# Patient Record
Sex: Male | Born: 1995 | Race: White | Hispanic: No | Marital: Single | Smoking: Never smoker
Health system: Southern US, Community
[De-identification: ages and names within clinical notes are randomized; demographics above are authoritative.]

## PROBLEM LIST (undated history)

## (undated) DIAGNOSIS — B2790 Infectious mononucleosis, unspecified without complication: Secondary | ICD-10-CM

## (undated) DIAGNOSIS — F419 Anxiety disorder, unspecified: Secondary | ICD-10-CM

## (undated) DIAGNOSIS — G43909 Migraine, unspecified, not intractable, without status migrainosus: Secondary | ICD-10-CM

## (undated) DIAGNOSIS — J020 Streptococcal pharyngitis: Secondary | ICD-10-CM

## (undated) DIAGNOSIS — M869 Osteomyelitis, unspecified: Secondary | ICD-10-CM

## (undated) DIAGNOSIS — F909 Attention-deficit hyperactivity disorder, unspecified type: Secondary | ICD-10-CM

## (undated) HISTORY — PX: KNEE SURGERY: SHX244

---
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
## 2013-11-05 IMAGING — CR DG KNEE COMPLETE 4+V*L*
4 series · 4 of 4 positions shown · non-contrast
Comparison: MRI 07/31/2010.

CLINICAL DATA: Left knee pain.

LEFT KNEE - COMPLETE 4+ VIEW

[t knee ap left]
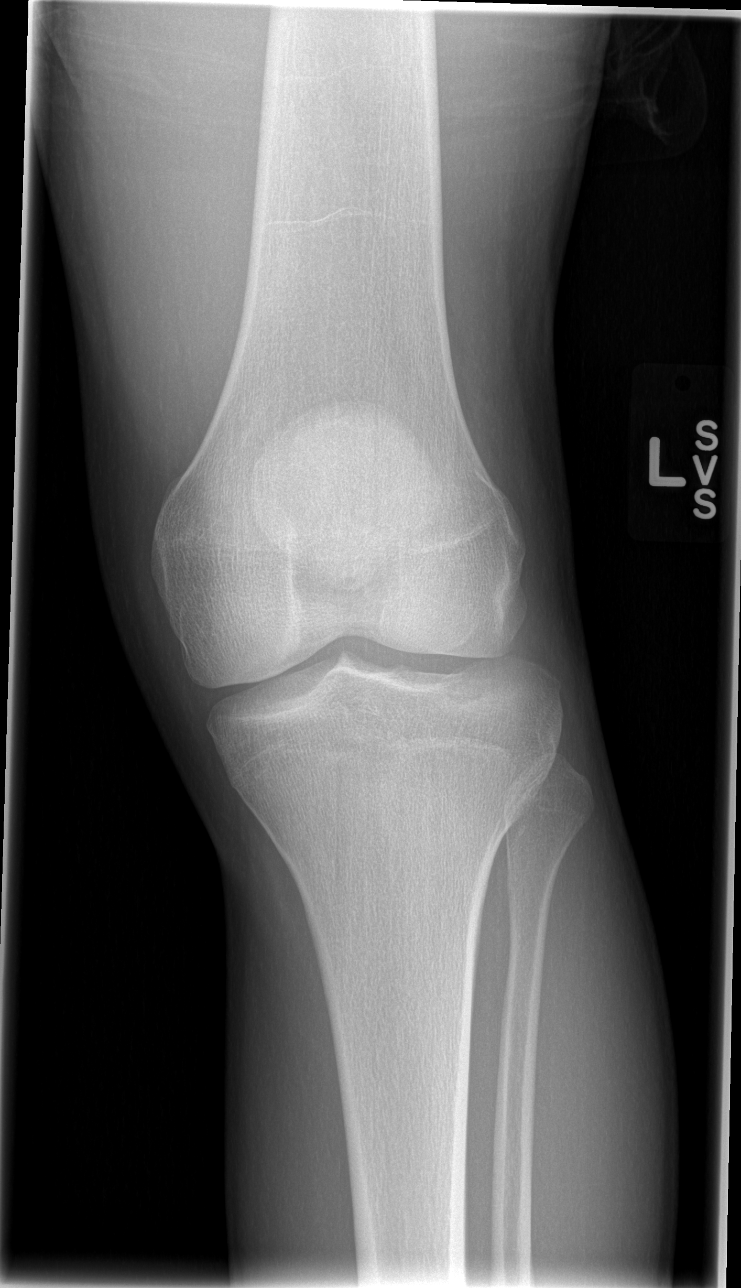

[t knee oblique left (1 of 2)]
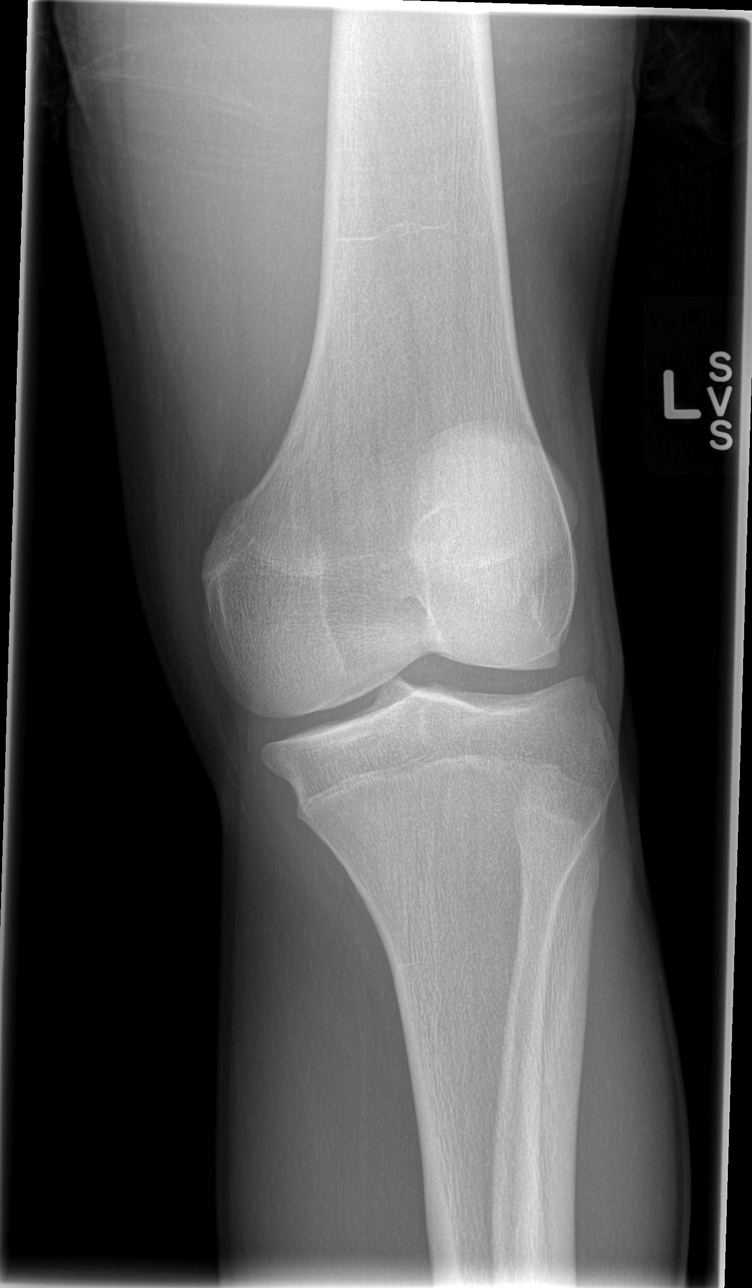

[t knee oblique left (2 of 2)]
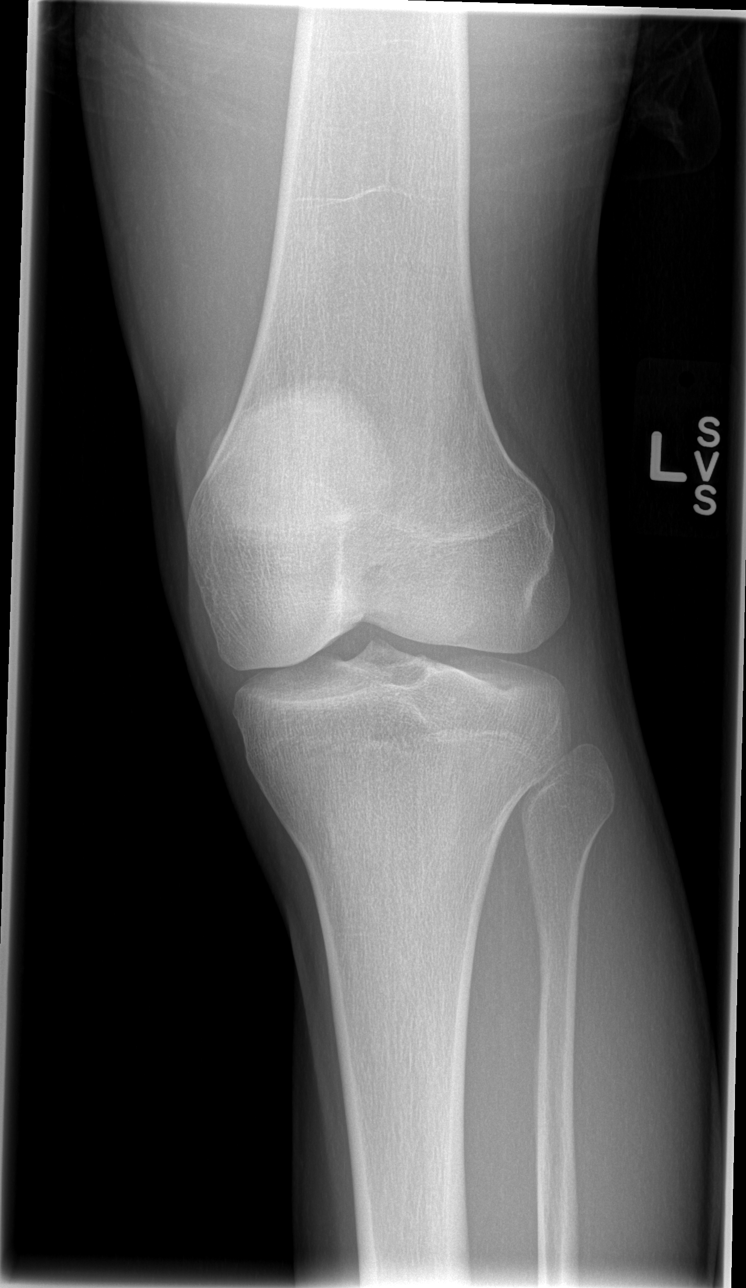

[t knee lat left]
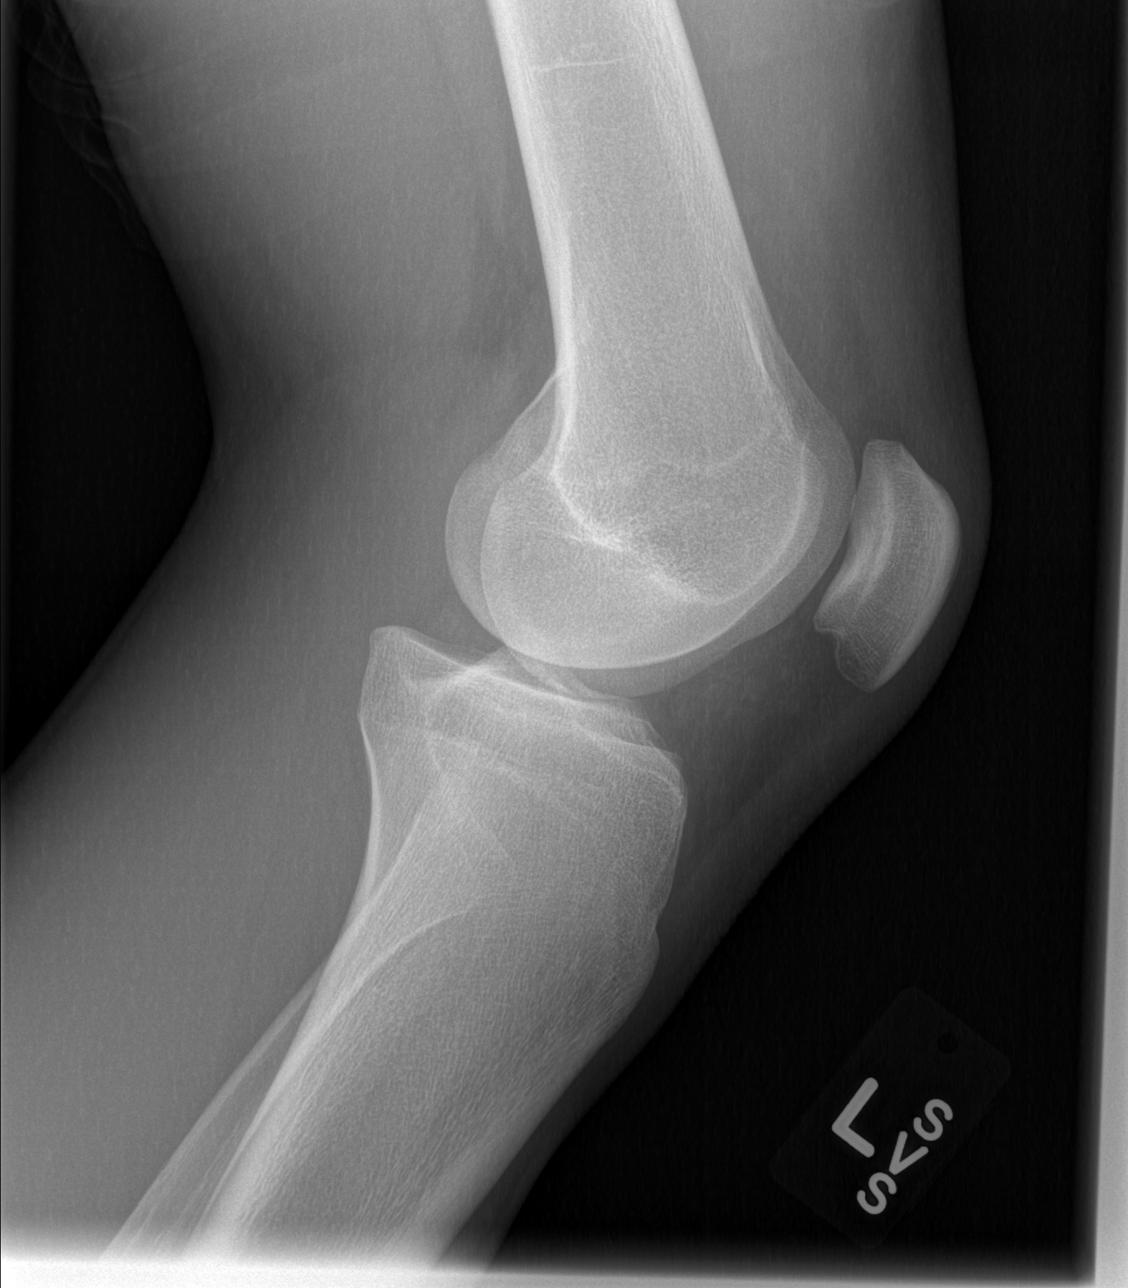

[4 of 4 positions shown; findings below may reference images not displayed]

FINDINGS: The joint spaces are maintained.  No acute bony findings.
No osteochondral lesion.  No joint effusion.
IMPRESSION: No acute bony findings or joint effusion.

## 2013-11-29 ENCOUNTER — Emergency Department: Payer: Self-pay | Admitting: Emergency Medicine

## 2013-11-29 LAB — URINALYSIS, COMPLETE
BACTERIA: NONE SEEN
Bilirubin,UR: NEGATIVE
Blood: NEGATIVE
Glucose,UR: NEGATIVE mg/dL (ref 0–75)
Ketone: NEGATIVE
LEUKOCYTE ESTERASE: NEGATIVE
Nitrite: NEGATIVE
PROTEIN: NEGATIVE
Ph: 7 (ref 4.5–8.0)
RBC,UR: NONE SEEN /HPF (ref 0–5)
SPECIFIC GRAVITY: 1.002 (ref 1.003–1.030)
SQUAMOUS EPITHELIAL: NONE SEEN
WBC UR: <1 /HPF (ref 0–5)

## 2013-11-29 LAB — CBC
HCT: 43.4 % (ref 40.0–52.0)
HGB: 14.8 g/dL (ref 13.0–18.0)
MCH: 30.2 pg (ref 26.0–34.0)
MCHC: 34 g/dL (ref 32.0–36.0)
MCV: 89 fL (ref 80–100)
PLATELETS: 188 10*3/uL (ref 150–440)
RBC: 4.9 10*6/uL (ref 4.40–5.90)
RDW: 12.4 % (ref 11.5–14.5)
WBC: 8.8 10*3/uL (ref 3.8–10.6)

## 2013-11-30 LAB — MONONUCLEOSIS SCREEN: Mono Test: NEGATIVE

## 2013-12-02 LAB — BETA STREP CULTURE(ARMC)

## 2014-01-08 ENCOUNTER — Emergency Department: Payer: Self-pay | Admitting: Emergency Medicine

## 2014-01-08 LAB — COMPREHENSIVE METABOLIC PANEL
ALBUMIN: 4.8 g/dL (ref 3.8–5.6)
ALK PHOS: 83 U/L
AST: 27 U/L (ref 10–41)
Anion Gap: 5 — ABNORMAL LOW (ref 7–16)
BILIRUBIN TOTAL: 2.3 mg/dL — AB (ref 0.2–1.0)
BUN: 12 mg/dL (ref 7–18)
CHLORIDE: 102 mmol/L (ref 98–107)
CO2: 30 mmol/L (ref 21–32)
Calcium, Total: 9.9 mg/dL (ref 9.0–10.7)
Creatinine: 0.81 mg/dL (ref 0.60–1.30)
EGFR (African American): >60
EGFR (Non-African Amer.): >60
Glucose: 93 mg/dL (ref 65–99)
Osmolality: 273 (ref 275–301)
Potassium: 3.6 mmol/L (ref 3.5–5.1)
SGPT (ALT): 27 U/L (ref 12–78)
SODIUM: 137 mmol/L (ref 136–145)
Total Protein: 8.5 g/dL (ref 6.4–8.6)

## 2014-01-08 LAB — CBC WITH DIFFERENTIAL/PLATELET
Basophil #: 0.1 10*3/uL (ref 0.0–0.1)
Basophil %: 0.8 %
Eosinophil #: 0.3 10*3/uL (ref 0.0–0.7)
Eosinophil %: 2.3 %
HCT: 44.2 % (ref 40.0–52.0)
HGB: 14.9 g/dL (ref 13.0–18.0)
LYMPHS PCT: 21.6 %
Lymphocyte #: 2.4 10*3/uL (ref 1.0–3.6)
MCH: 30.2 pg (ref 26.0–34.0)
MCHC: 33.7 g/dL (ref 32.0–36.0)
MCV: 90 fL (ref 80–100)
MONO ABS: 0.6 x10 3/mm (ref 0.2–1.0)
Monocyte %: 5.3 %
NEUTROS ABS: 7.9 10*3/uL — AB (ref 1.4–6.5)
NEUTROS PCT: 70 %
Platelet: 220 10*3/uL (ref 150–440)
RBC: 4.93 10*6/uL (ref 4.40–5.90)
RDW: 12.9 % (ref 11.5–14.5)
WBC: 11.3 10*3/uL — ABNORMAL HIGH (ref 3.8–10.6)

## 2014-01-15 ENCOUNTER — Emergency Department (HOSPITAL_BASED_OUTPATIENT_CLINIC_OR_DEPARTMENT_OTHER)
Admission: EM | Admit: 2014-01-15 | Discharge: 2014-01-16 | Disposition: A | Discharge status: Home / Self Care | Payer: Self-pay | Attending: Emergency Medicine | Admitting: Emergency Medicine

## 2014-01-15 ENCOUNTER — Emergency Department (HOSPITAL_BASED_OUTPATIENT_CLINIC_OR_DEPARTMENT_OTHER): Payer: Self-pay

## 2014-01-15 ENCOUNTER — Encounter (HOSPITAL_BASED_OUTPATIENT_CLINIC_OR_DEPARTMENT_OTHER): Payer: Self-pay | Admitting: Emergency Medicine

## 2014-01-15 DIAGNOSIS — Y939 Activity, unspecified: Secondary | ICD-10-CM | POA: Insufficient documentation

## 2014-01-15 DIAGNOSIS — Z880 Allergy status to penicillin: Secondary | ICD-10-CM | POA: Insufficient documentation

## 2014-01-15 DIAGNOSIS — Z8669 Personal history of other diseases of the nervous system and sense organs: Secondary | ICD-10-CM | POA: Insufficient documentation

## 2014-01-15 DIAGNOSIS — Z8619 Personal history of other infectious and parasitic diseases: Secondary | ICD-10-CM | POA: Insufficient documentation

## 2014-01-15 DIAGNOSIS — T148XXA Other injury of unspecified body region, initial encounter: Secondary | ICD-10-CM

## 2014-01-15 DIAGNOSIS — IMO0002 Reserved for concepts with insufficient information to code with codable children: Secondary | ICD-10-CM | POA: Insufficient documentation

## 2014-01-15 DIAGNOSIS — X58XXXA Exposure to other specified factors, initial encounter: Secondary | ICD-10-CM | POA: Insufficient documentation

## 2014-01-15 DIAGNOSIS — Y929 Unspecified place or not applicable: Secondary | ICD-10-CM | POA: Insufficient documentation

## 2014-01-15 DIAGNOSIS — F909 Attention-deficit hyperactivity disorder, unspecified type: Secondary | ICD-10-CM | POA: Insufficient documentation

## 2014-01-15 DIAGNOSIS — F411 Generalized anxiety disorder: Secondary | ICD-10-CM | POA: Insufficient documentation

## 2014-01-15 DIAGNOSIS — Z8739 Personal history of other diseases of the musculoskeletal system and connective tissue: Secondary | ICD-10-CM | POA: Insufficient documentation

## 2014-01-15 HISTORY — DX: Attention-deficit hyperactivity disorder, unspecified type: F90.9

## 2014-01-15 HISTORY — DX: Infectious mononucleosis, unspecified without complication: B27.90

## 2014-01-15 HISTORY — DX: Streptococcal pharyngitis: J02.0

## 2014-01-15 HISTORY — DX: Anxiety disorder, unspecified: F41.9

## 2014-01-15 HISTORY — DX: Migraine, unspecified, not intractable, without status migrainosus: G43.909

## 2014-01-15 MED ORDER — KETOROLAC TROMETHAMINE 60 MG/2ML IM SOLN
60.0000 mg | Freq: Once | INTRAMUSCULAR | Status: AC
  Administered 2014-01-15: 60 mg via INTRAMUSCULAR
  Filled 2014-01-15: qty 2

## 2014-01-15 NOTE — ED Provider Notes (Signed)
CSN: [id number]     Arrival date & time 01/15/14  2255 History   First MD Initiated Contact with Patient 01/15/14 2325     This chart was scribed for Dylan Burback Smitty Cords, MD by Dylan Mejia, ED Scribe. This patient was seen in room MH01/MH01 and the patient's care was started 11:50 PM.   Chief Complaint  Patient presents with  . Back Pain    Patient is a 18 y.o. male presenting with back pain. The history is provided by the patient and a parent.  Back Pain Pain location: right scapula. Quality:  Aching Radiates to:  R shoulder Pain severity:  Severe Pain is:  Same all the time Onset quality:  Gradual Timing:  Constant Progression:  Unchanged Chronicity:  New Context: not recent injury   Relieved by:  Nothing Worsened by:  Nothing tried Ineffective treatments:  None tried Associated symptoms: no chest pain, no fever, no numbness, no paresthesias and no weakness   Risk factors: no hx of cancer     HPI Comments: Dylan Mejia is a 18 y.o. male who presents to the Emergency Department complaining of constant, moderate R scapula and shoulder pain x  2 weeks. Pt states this pain is exacerbated with palpation and certain movements. States pain is intense enough to keep him from sleep. He has tried OTC ibuprofen and Tylenol without any improvement. He has also tried rest to the area without any relief. He denies any long distance travel. No fever, chills, numbness, or loss of sensation. He has no other pertinent past medical history. No other concerns this visit.  Past Medical History  Diagnosis Date  . Osteomyelitis   . Mononucleosis   . Migraine   . Strep throat   . Anxiety   . ADHD (attention deficit hyperactivity disorder)    Past Surgical History  Procedure Laterality Date  . Knee surgery Bilateral    No family history on file. History  Substance Use Topics  . Smoking status: Never Smoker   . Smokeless tobacco: Not on file  . Alcohol Use: No    Review of Systems   Constitutional: Negative for fever.  HENT: Negative for congestion.   Eyes: Negative for redness.  Respiratory: Negative for cough and shortness of breath.   Cardiovascular: Negative for chest pain, palpitations and leg swelling.  Musculoskeletal: Positive for back pain. Negative for joint swelling, neck pain and neck stiffness.  Skin: Negative for rash.  Neurological: Negative for weakness, numbness and paresthesias.  Psychiatric/Behavioral: Negative for confusion.  All other systems reviewed and are negative.     Allergies  Penicillins  Home Medications   Prior to Admission medications   Medication Sig Start Date End Date Taking? Authorizing Provider  acetaminophen (TYLENOL) 325 MG tablet Take 650 mg by mouth every 6 (six) hours as needed. For pain    Historical Provider, MD  amphetamine-dextroamphetamine (ADDERALL) 20 MG tablet Take 20-40 mg by mouth daily as needed. For ADD    Historical Provider, MD  escitalopram (LEXAPRO) 10 MG tablet Take 10 mg by mouth daily.    Historical Provider, MD  loratadine (CLARITIN) 10 MG tablet Take 10 mg by mouth daily as needed. For allergies    Historical Provider, MD   Triage Vitals: BP 117/67  Pulse 82  Temp(Src) 97.9 F (36.6 C) (Oral)  Resp 18  Ht 6\' 1"  (1.854 m)  Wt 140 lb (63.504 kg)  BMI 18.47 kg/m2  SpO2 100%   Physical Exam  Nursing note and  vitals reviewed. Constitutional: He is oriented to person, place, and time. He appears well-developed and well-nourished.  HENT:  Head: Normocephalic and atraumatic.  Mouth/Throat: Oropharynx is clear and moist.  Eyes: Conjunctivae and EOM are normal. Pupils are equal, round, and reactive to light.  Neck: Normal range of motion. Neck supple. No thyromegaly present.  Cardiovascular: Normal rate, regular rhythm, normal heart sounds and intact distal pulses.   Pulmonary/Chest: Effort normal and breath sounds normal. No respiratory distress. He has no wheezes. He has no rales.  Abdominal:  Soft. He exhibits no distension. There is no tenderness. There is no rebound and no guarding.  Musculoskeletal: Normal range of motion. He exhibits no edema.  No winging of R scapula Negative NEERs test to R side  Clavicles intact  Symmetric bulk of B arms intact biceps and triceps tendons and reflexes FROM of the RUW  Lymphadenopathy:    He has no cervical adenopathy.  Neurological: He is alert and oriented to person, place, and time. He has normal reflexes. He displays normal reflexes. He exhibits normal muscle tone.  Strength 5/5 B upper extremities NVI intact BUE and hands cap refill < 2 sec.  FROM of B shoulders  Skin: Skin is warm and dry.  Psychiatric: He has a normal mood and affect. Judgment normal.    ED Course  Procedures (including critical care time)  DIAGNOSTIC STUDIES: Oxygen Saturation is 100% on RA, Normal by my interpretation.    COORDINATION OF CARE: 11:50 PM- Will order X-Rays. Will give Toradol. Discussed treatment plan with pt at bedside and pt agreed to plan.       Labs Review Labs Reviewed - No data to display  Imaging Review No results found.   EKG Interpretation None      MDM   Final diagnoses:  None    Muscular pain.  Heat NSAIDs muscle relaxants follow up with sports medicine for ongoing care  I personally performed the services described in this documentation, which was scribed in my presence. The recorded information has been reviewed and is accurate.    Dylan Iglehart Smitty CordsK Marzetta Lanza-Rasch, MD 01/16/14 Moses [id number]

## 2014-01-15 NOTE — ED Notes (Signed)
Right scapula pain x2 weeks without known injury.  Pain and tenderness below right scapula. Worse with movement.  Intermittent tingling in right hand.  Sts he is not able to eat or sleep normally due to the pain.

## 2014-01-16 ENCOUNTER — Encounter (HOSPITAL_BASED_OUTPATIENT_CLINIC_OR_DEPARTMENT_OTHER): Payer: Self-pay | Admitting: Emergency Medicine

## 2014-01-16 MED ORDER — DICLOFENAC SODIUM 50 MG PO TBEC: 50.0000 mg | DELAYED_RELEASE_TABLET | Freq: Three times a day (TID) | ORAL | Status: DC

## 2014-01-16 MED ORDER — METHOCARBAMOL 500 MG PO TABS: 500.0000 mg | ORAL_TABLET | Freq: Two times a day (BID) | ORAL | Status: DC

## 2014-01-16 MED ORDER — HYDROCODONE-ACETAMINOPHEN 5-325 MG PO TABS: 1.0000 | ORAL_TABLET | Freq: Four times a day (QID) | ORAL | Status: DC | PRN

## 2014-01-16 MED ORDER — METHOCARBAMOL 500 MG PO TABS
1000.0000 mg | ORAL_TABLET | Freq: Once | ORAL | Status: AC
  Administered 2014-01-16: 1000 mg via ORAL
  Filled 2014-01-16: qty 2

## 2014-01-16 NOTE — Discharge Instructions (Signed)

## 2014-06-02 ENCOUNTER — Encounter (HOSPITAL_COMMUNITY): Payer: Self-pay | Admitting: Emergency Medicine

## 2014-06-02 DIAGNOSIS — M545 Low back pain: Secondary | ICD-10-CM | POA: Insufficient documentation

## 2014-06-02 DIAGNOSIS — B349 Viral infection, unspecified: Secondary | ICD-10-CM | POA: Insufficient documentation

## 2014-06-02 DIAGNOSIS — F909 Attention-deficit hyperactivity disorder, unspecified type: Secondary | ICD-10-CM | POA: Insufficient documentation

## 2014-06-02 DIAGNOSIS — Z8739 Personal history of other diseases of the musculoskeletal system and connective tissue: Secondary | ICD-10-CM | POA: Insufficient documentation

## 2014-06-02 DIAGNOSIS — R231 Pallor: Secondary | ICD-10-CM | POA: Insufficient documentation

## 2014-06-02 DIAGNOSIS — Z8679 Personal history of other diseases of the circulatory system: Secondary | ICD-10-CM | POA: Insufficient documentation

## 2014-06-02 DIAGNOSIS — Z880 Allergy status to penicillin: Secondary | ICD-10-CM | POA: Insufficient documentation

## 2014-06-02 LAB — CBC WITH DIFFERENTIAL/PLATELET
BASOS ABS: 0 10*3/uL (ref 0.0–0.1)
BASOS PCT: 0 % (ref 0–1)
EOS ABS: 0.1 10*3/uL (ref 0.0–0.7)
EOS PCT: 1 % (ref 0–5)
HCT: 43.6 % (ref 39.0–52.0)
Hemoglobin: 15.4 g/dL (ref 13.0–17.0)
LYMPHS PCT: 24 % (ref 12–46)
Lymphs Abs: 2.5 10*3/uL (ref 0.7–4.0)
MCH: 30 pg (ref 26.0–34.0)
MCHC: 35.3 g/dL (ref 30.0–36.0)
MCV: 85 fL (ref 78.0–100.0)
Monocytes Absolute: 0.7 10*3/uL (ref 0.1–1.0)
Monocytes Relative: 6 % (ref 3–12)
Neutro Abs: 7.2 10*3/uL (ref 1.7–7.7)
Neutrophils Relative %: 69 % (ref 43–77)
PLATELETS: 273 10*3/uL (ref 150–400)
RBC: 5.13 MIL/uL (ref 4.22–5.81)
RDW: 12 % (ref 11.5–15.5)
WBC: 10.5 10*3/uL (ref 4.0–10.5)

## 2014-06-02 LAB — BASIC METABOLIC PANEL
ANION GAP: 13 (ref 5–15)
BUN: 19 mg/dL (ref 6–23)
CALCIUM: 10.1 mg/dL (ref 8.4–10.5)
CO2: 30 meq/L (ref 19–32)
Chloride: 94 mEq/L — ABNORMAL LOW (ref 96–112)
Creatinine, Ser: 0.98 mg/dL (ref 0.50–1.35)
Glucose, Bld: 79 mg/dL (ref 70–99)
Potassium: 3.7 mEq/L (ref 3.7–5.3)
SODIUM: 137 meq/L (ref 137–147)

## 2014-06-02 NOTE — ED Notes (Signed)
Patient presents stating he has been feeling bad.  States he has generalized body aches no appetite

## 2014-06-03 ENCOUNTER — Emergency Department (HOSPITAL_COMMUNITY)
Admission: EM | Admit: 2014-06-03 | Discharge: 2014-06-03 | Disposition: A | Discharge status: Home / Self Care | Payer: Self-pay | Attending: Emergency Medicine | Admitting: Emergency Medicine

## 2014-06-03 DIAGNOSIS — B349 Viral infection, unspecified: Secondary | ICD-10-CM

## 2014-06-03 LAB — URINALYSIS, ROUTINE W REFLEX MICROSCOPIC
Glucose, UA: NEGATIVE mg/dL
Hgb urine dipstick: NEGATIVE
KETONES UR: NEGATIVE mg/dL
LEUKOCYTES UA: NEGATIVE
NITRITE: NEGATIVE
PROTEIN: NEGATIVE mg/dL
Specific Gravity, Urine: 1.025 (ref 1.005–1.030)
UROBILINOGEN UA: 1 mg/dL (ref 0.0–1.0)
pH: 6 (ref 5.0–8.0)

## 2014-06-03 MED ORDER — METHOCARBAMOL 750 MG PO TABS: 750.0000 mg | ORAL_TABLET | Freq: Three times a day (TID) | ORAL | Status: DC | PRN

## 2014-06-03 MED ORDER — TRAMADOL HCL 50 MG PO TABS
100.0000 mg | ORAL_TABLET | Freq: Once | ORAL | Status: AC
Start: 2014-06-03 — End: 2014-06-03
  Administered 2014-06-03: 100 mg via ORAL
  Filled 2014-06-03: qty 2

## 2014-06-03 MED ORDER — TRAMADOL HCL 50 MG PO TABS: 50.0000 mg | ORAL_TABLET | Freq: Four times a day (QID) | ORAL | Status: AC | PRN

## 2014-06-03 MED ORDER — METHOCARBAMOL 500 MG PO TABS
1000.0000 mg | ORAL_TABLET | Freq: Once | ORAL | Status: AC
  Administered 2014-06-03: 1000 mg via ORAL
  Filled 2014-06-03: qty 2

## 2014-06-03 NOTE — ED Notes (Signed)
Patient presents stating that he has been feeling bad for about 5 days.  States his lower back has been hurting and then generalized body aches.

## 2014-06-03 NOTE — ED Provider Notes (Signed)
CSN: [id number]     Arrival date & time 06/02/14  2039 History   First MD Initiated Contact with Patient 06/03/14 0121     Chief Complaint  Patient presents with  . Generalized Body Aches  . Fever     (Consider location/radiation/quality/duration/timing/severity/associated sxs/prior Treatment) HPI -year-old male presents to emergency room from home with complaint of one week of fatigue, 34 days of intermittent fever chills and myalgias with low back pain.  He denies any dysuria.  No rash.  No headache, no stiff neck.  No sick contacts.  He's been taking Tylenol and Motrin along with cough and cold medicine without improvement in symptoms.  He reports he has no appetite but denies any nausea or vomiting. Past Medical History  Diagnosis Date  . Osteomyelitis   . Mononucleosis   . Migraine   . Strep throat   . Anxiety   . ADHD (attention deficit hyperactivity disorder)    Past Surgical History  Procedure Laterality Date  . Knee surgery Bilateral    History reviewed. No pertinent family history. History  Substance Use Topics  . Smoking status: Never Smoker   . Smokeless tobacco: Not on file  . Alcohol Use: No    Review of Systems   See History of Present Illness; otherwise all other systems are reviewed and negative  Allergies  Penicillins  Home Medications   Prior to Admission medications   Medication Sig Start Date End Date Taking? Authorizing Provider  acetaminophen (TYLENOL) 325 MG tablet Take 650 mg by mouth every 6 (six) hours as needed. For pain   Yes Historical Provider, MD  amphetamine-dextroamphetamine (ADDERALL) 20 MG tablet Take 20-40 mg by mouth daily as needed. For ADD   Yes Historical Provider, MD  ibuprofen (ADVIL,MOTRIN) 200 MG tablet Take 200 mg by mouth every 6 (six) hours as needed for moderate pain.   Yes Historical Provider, MD   BP 114/63  Pulse 71  Temp(Src) 98 F (36.7 C) (Oral)  Resp 15  SpO2 96% Physical Exam  Nursing note and vitals  reviewed. Constitutional: He is oriented to person, place, and time. He appears well-developed and well-nourished. He appears distressed ( uncomfortable appearing).  HENT:  Head: Normocephalic and atraumatic.  Nose: Nose normal.  Mouth/Throat: Oropharynx is clear and moist.  Dry mucous membranes, cracks in the corners of the lips, pale  Eyes: Conjunctivae and EOM are normal. Pupils are equal, round, and reactive to light.  Neck: Normal range of motion. Neck supple. No JVD present. No tracheal deviation present. No thyromegaly present.  Cardiovascular: Normal rate, regular rhythm, normal heart sounds and intact distal pulses.  Exam reveals no gallop and no friction rub.   No murmur heard. Pulmonary/Chest: Effort normal and breath sounds normal. No stridor. No respiratory distress. He has no wheezes. He has no rales. He exhibits no tenderness.  Abdominal: Soft. Bowel sounds are normal. He exhibits no distension and no mass. There is no tenderness. There is no rebound and no guarding.  Musculoskeletal: Normal range of motion. He exhibits tenderness (tender to palpation along paraspinal muscles in low back, no central tenderness no step-off or crepitus no warmth or erythema). He exhibits no edema.  Lymphadenopathy:    He has no cervical adenopathy.  Neurological: He is alert and oriented to person, place, and time. He displays normal reflexes. He exhibits normal muscle tone. Coordination normal.  Skin: Skin is warm and dry. No rash noted. No erythema. There is pallor.  Psychiatric: He has a  normal mood and affect. His behavior is normal. Judgment and thought content normal.    ED Course  Procedures (including critical care time) Labs Review Labs Reviewed  BASIC METABOLIC PANEL - Abnormal; Notable for the following:    Chloride 94 (*)    All other components within normal limits  URINALYSIS, ROUTINE W REFLEX MICROSCOPIC - Abnormal; Notable for the following:    APPearance CLOUDY (*)     Bilirubin Urine SMALL (*)    All other components within normal limits  CBC WITH DIFFERENTIAL    Imaging Review No results found.   EKG Interpretation None      MDM   Final diagnoses:  Viral syndrome    18 year old male with viral type symptoms, no fevers here.  Labs are unremarkable.  We'll give additional pain control for his low back pain.  Doubt serious cause of his back pain such as discitis, epidural abscess, pyelonephritis.    Olivia Mackielga M Nylani Michetti, MD 06/03/14 [phone]

## 2014-06-03 NOTE — Discharge Instructions (Signed)
You have a viral syndrome. This can last from 5-10 days. Alternate Tylenol and Motrin every 4-6 hours for fever control as well as body aches and chills. Drink plenty of fluids. Rest. Return to the emergency room for worsening condition or new concerning symptoms. Follow up with your regular doctor. If you don't have a regular doctor use one of the numbers below to establish a primary care doctor. ° ° °Emergency Department Resource Guide °1) Find a Doctor and Pay Out of Pocket °Although you won't have to find out who is covered by your insurance plan, it is a good idea to ask around and get recommendations. You will then need to call the office and see if the doctor you have chosen will accept you as a new patient and what types of options they offer for patients who are self-pay. Some doctors offer discounts or will set up payment plans for their patients who do not have insurance, but you will need to ask so you aren't surprised when you get to your appointment. ° °2) Contact Your Local Health Department °Not all health departments have doctors that can see patients for sick visits, but many do, so it is worth a call to see if yours does. If you don't know where your local health department is, you can check in your phone book. The CDC also has a tool to help you locate your state's health department, and many state websites also have listings of all of their local health departments. ° °3) Find a Walk-in Clinic °If your illness is not likely to be very severe or complicated, you may want to try a walk in clinic. These are popping up all over the country in pharmacies, drugstores, and shopping centers. They're usually staffed by nurse practitioners or physician assistants that have been trained to treat common illnesses and complaints. They're usually fairly quick and inexpensive. However, if you have serious medical issues or chronic medical problems, these are probably not your best option. ° °No Primary Care  Doctor: °- Call [facility] at  [phone] - they can help you locate a primary care doctor that  accepts your insurance, provides certain services, etc. °- Physician Referral Service- 1-800-533-3463 ° ° °Medication Assistance: °Organization         Address  Phone   Notes  °[place] [facility] [address] °[place], [place] [address] [phone]) [phone] --Must be a resident of [place] °-- Must have NO insurance coverage whatsoever (no Medicaid/ Medicare, etc.) °-- The pt. MUST have a primary care doctor that directs their care regularly and follows them in the community °  °[facility]  (866) 331-1348   °[employer]  (888) 892-1162   ° °Agencies that provide inexpensive medical care: °Organization         Address  Phone   Notes  °[facility]  ([phone]) [phone]   °[facility]    ([phone]) [phone]   °[facility] [address] °[place],  [address] [phone]) [phone]   °[facility] [address], °[place] ([phone]) [phone]   °[facility]    (336) 373-0678   °[facility]    ([phone]) [phone]   °[facility] ° [address], [place] Phone:  (336) [phone], Fax:  ([phone]) [phone] Hours of Operation:  9 am - 6 pm, M-F.  Also accepts Medicaid/Medicare and self-pay.  °[facility] °   [address], [place] Phone: ([phone]) [phone], Fax: ([phone]) [phone]. Hours of Operation:  8:30 am - 5:30 pm, M-F.  Also accepts Medicaid and self-pay.  °[facility] [place] [address], [place] Phone: ([phone]) [phone]   °[facility] [address], [place], [place] (336)[phone], Ext. 123 Mondays & Thursdays: 7-9 AM.  First 15 patients are seen on a first come, first serve basis. °  ° °Medicaid-accepting [place] Providers: ° °Organization         Address  Phone   Notes  °[facility] [address],  [place] ([phone]) [phone] Also accepts self-pay patients.  °[facility] [address], [place] ° ([phone]) [phone]   °[facility] [address], [place] ([phone]) [phone]   °[facility] [address] [place] [address], [place] ([phone]) [phone]   °[facility] [address], [place]  ° ([phone]) [phone] Only accepts [place] Access Medicaid patients after they have their name applied to their card.  ° °Self-Pay (no insurance) in [place]: ° °Organization         Address  Phone   Notes  °Sickle Cell Patients, [facility] [address], [place] ([phone]) [phone]   °[facility] [address], [place] ([phone]) [phone]   °[facility] [place] [facility] ° [address], [place] ([phone]) [phone]   °[facility]/Dr. Osei-Bonsu ° [address] [place] [address], [place] or [address], [place] [phone] Phone number for both [place] and [place] locations is the same.  °[facility] [address], [place] ([phone]) [phone]   °[facility] [address] [place] [address], [place] or [address] ([phone]) [phone] °([phone]) [phone]   °[facility] [address],  ([phone]) [phone], phone; ([phone]) [phone], fax Sees patients 1st and 3rd Saturday of every month.  Must not qualify for public or private insurance (i.e. Medicaid, Medicare, [place] Health Choice, Veterans' Benefits) • Household income should be no more than 200% of the poverty level •The clinic cannot treat you if you are pregnant or think you are pregnant • Sexually transmitted diseases are not treated at the clinic.  ° ° ° ° ° ° ° °Antibiotic Nonuse ° Your caregiver felt that the infection or problem was not one that would be helped with an antibiotic. °Infections may be caused by viruses or bacteria. Only a caregiver can tell which one of these is  the likely cause of an illness. A cold is the most common cause of infection in both adults and children. A cold is a virus. Antibiotic treatment will have no effect on a viral infection. Viruses can lead to many lost days of work caring for sick children and many missed days of school. Children may catch as many as 10 "colds" or "flus" per year during which they can be tearful, cranky, and uncomfortable. The goal of treating a virus is aimed at keeping the ill person comfortable. °Antibiotics are medications used to help the body fight bacterial infections. There are relatively few types of bacteria that cause infections but there are hundreds of viruses. While both viruses and bacteria cause infection they are very different types of germs. A viral infection will typically go away by itself within 7 to 10 days. Bacterial infections may spread or   get worse without antibiotic treatment. °Examples of bacterial infections are: °· Sore throats (like strep throat or tonsillitis).  °· Infection in the lung (pneumonia).  °· Ear and skin infections.  ° ° °Viral Infections °A viral infection can be caused by different types of viruses. Most viral infections are not serious and resolve on their own. However, some infections may cause severe symptoms and may lead to further complications. °SYMPTOMS °Viruses can frequently cause: °· Minor sore throat.  °· Aches and pains.  °· Headaches.  °· Runny nose.  °· Different types of rashes.  °· Watery eyes.  °· Tiredness.  °· Cough.  °· Loss of appetite.  °· Gastrointestinal infections, resulting in nausea, vomiting, and diarrhea.  °These symptoms do not respond to antibiotics because the infection is not caused by bacteria. However, you might catch a bacterial infection following the viral infection. This is sometimes called a "superinfection." Symptoms of such a bacterial infection may include: °· Worsening sore throat with pus and difficulty swallowing.  °· Swollen neck glands.   °· Chills and a high or persistent fever.  °· Severe headache.  °· Tenderness over the sinuses.  °· Persistent overall ill feeling (malaise), muscle aches, and tiredness (fatigue).  °· Persistent cough.  °· Yellow, green, or brown mucus production with coughing.  °HOME CARE INSTRUCTIONS  °· Only take over-the-counter or prescription medicines for pain, discomfort, diarrhea, or fever as directed by your caregiver.  °· Drink enough water and fluids to keep your urine clear or pale yellow. Sports drinks can provide valuable electrolytes, sugars, and hydration.  °· Get plenty of rest and maintain proper nutrition. Soups and broths with crackers or rice are fine.  °SEEK IMMEDIATE MEDICAL CARE IF:  °· You have severe headaches, shortness of breath, chest pain, neck pain, or an unusual rash.  °· You have uncontrolled vomiting, diarrhea, or you are unable to keep down fluids.  °· You or your child has an oral temperature above 102° F (38.9° C), not controlled by medicine.  °· Your baby is older than 3 months with a rectal temperature of 102° F (38.9° C) or higher.  °· Your baby is 3 months old or younger with a rectal temperature of 100.4° F (38° C) or higher.  °MAKE SURE YOU:  °· Understand these instructions.  °· Will watch your condition.  °· Will get help right away if you are not doing well or get worse.  °Document Released: 05/18/2005 Document Revised: 04/20/2011 Document Reviewed: 12/13/2010 °ExitCare® Patient Information ©2012 [employer] ° °.Viral Syndrome °You or your child has Viral Syndrome. It is the most common infection causing "colds" and infections in the nose, throat, sinuses, and breathing tubes. Sometimes the infection causes nausea, vomiting, or diarrhea. The germ that causes the infection is a virus. No antibiotic or other medicine will kill it. There are medicines that you can take to make you or your child more comfortable.  °HOME CARE INSTRUCTIONS  °· Rest in bed until you start to feel better.   °· If you have diarrhea or vomiting, eat small amounts of crackers and toast. Soup is helpful.  °· Do not give aspirin or medicine that contains aspirin to children.  °· Only take over-the-counter or prescription medicines for pain, discomfort, or fever as directed by your caregiver.  °SEEK IMMEDIATE MEDICAL CARE IF:  °· You or your child has not improved within one week.  °· You or your child has pain that is not at least partially relieved   by over-the-counter medicine.  °· Thick, colored mucus or blood is coughed up.  °· Discharge from the nose becomes thick yellow or green.  °· Diarrhea or vomiting gets worse.  °· There is any major change in your or your child's condition.  °· You or your child develops a skin rash, stiff neck, severe headache, or are unable to hold down food or fluid.  °· You or your child has an oral temperature above 102° F (38.9° C), not controlled by medicine.  °· Your baby is older than 3 months with a rectal temperature of 102° F (38.9° C) or higher.  °· Your baby is 3 months old or younger with a rectal temperature of 100.4° F (38° C) or higher.  °Document Released: 07/24/2006 Document Revised: 04/20/2011 Document Reviewed: 07/25/2007 °ExitCare® Patient Information ©2012 [employer]. ° °

## 2014-08-18 ENCOUNTER — Emergency Department (HOSPITAL_BASED_OUTPATIENT_CLINIC_OR_DEPARTMENT_OTHER)
Admission: EM | Admit: 2014-08-18 | Discharge: 2014-08-18 | Disposition: A | Discharge status: Home / Self Care | Payer: Self-pay | Attending: Emergency Medicine | Admitting: Emergency Medicine

## 2014-08-18 ENCOUNTER — Encounter (HOSPITAL_BASED_OUTPATIENT_CLINIC_OR_DEPARTMENT_OTHER): Payer: Self-pay

## 2014-08-18 ENCOUNTER — Emergency Department (HOSPITAL_BASED_OUTPATIENT_CLINIC_OR_DEPARTMENT_OTHER): Payer: Self-pay

## 2014-08-18 DIAGNOSIS — M545 Low back pain, unspecified: Secondary | ICD-10-CM

## 2014-08-18 DIAGNOSIS — Z880 Allergy status to penicillin: Secondary | ICD-10-CM | POA: Insufficient documentation

## 2014-08-18 DIAGNOSIS — Z8619 Personal history of other infectious and parasitic diseases: Secondary | ICD-10-CM | POA: Insufficient documentation

## 2014-08-18 DIAGNOSIS — F419 Anxiety disorder, unspecified: Secondary | ICD-10-CM | POA: Insufficient documentation

## 2014-08-18 DIAGNOSIS — Z8709 Personal history of other diseases of the respiratory system: Secondary | ICD-10-CM | POA: Insufficient documentation

## 2014-08-18 DIAGNOSIS — R300 Dysuria: Secondary | ICD-10-CM | POA: Insufficient documentation

## 2014-08-18 DIAGNOSIS — F909 Attention-deficit hyperactivity disorder, unspecified type: Secondary | ICD-10-CM | POA: Insufficient documentation

## 2014-08-18 DIAGNOSIS — Z8679 Personal history of other diseases of the circulatory system: Secondary | ICD-10-CM | POA: Insufficient documentation

## 2014-08-18 DIAGNOSIS — Z79899 Other long term (current) drug therapy: Secondary | ICD-10-CM | POA: Insufficient documentation

## 2014-08-18 DIAGNOSIS — M5431 Sciatica, right side: Secondary | ICD-10-CM

## 2014-08-18 LAB — URINALYSIS, ROUTINE W REFLEX MICROSCOPIC
Bilirubin Urine: NEGATIVE
Glucose, UA: NEGATIVE mg/dL
Hgb urine dipstick: NEGATIVE
Ketones, ur: NEGATIVE mg/dL
Leukocytes, UA: NEGATIVE
NITRITE: NEGATIVE
Protein, ur: NEGATIVE mg/dL
SPECIFIC GRAVITY, URINE: 1.021 (ref 1.005–1.030)
UROBILINOGEN UA: 1 mg/dL (ref 0.0–1.0)
pH: 6 (ref 5.0–8.0)

## 2014-08-18 MED ORDER — HYDROMORPHONE HCL 1 MG/ML IJ SOLN: 1.0000 mg | Freq: Once | INTRAMUSCULAR | Status: DC

## 2014-08-18 MED ORDER — OXYCODONE-ACETAMINOPHEN 5-325 MG PO TABS
1.0000 | ORAL_TABLET | Freq: Once | ORAL | Status: AC
  Administered 2014-08-18: 1 via ORAL
  Filled 2014-08-18: qty 1

## 2014-08-18 MED ORDER — OXYCODONE-ACETAMINOPHEN 5-325 MG PO TABS: 1.0000 | ORAL_TABLET | Freq: Four times a day (QID) | ORAL | Status: AC | PRN

## 2014-08-18 NOTE — ED Provider Notes (Signed)
CSN: [id number]     Arrival date & time 08/18/14  0746 History   First MD Initiated Contact with Patient 08/18/14 0750     Chief Complaint  Patient presents with  . Back Pain  . Leg Pain     (Consider location/radiation/quality/duration/timing/severity/associated sxs/prior Treatment) Patient is a 18 y.o. male presenting with back pain and leg pain. The history is provided by the patient.  Back Pain Location:  Lumbar spine Quality:  Aching Radiates to:  R thigh Pain severity:  Moderate Pain is:  Same all the time Onset quality:  Gradual Duration:  3 days Timing:  Constant Progression:  Unchanged Chronicity:  New Context comment:  Spontaneously Relieved by:  Nothing Worsened by:  Bending and ambulation Ineffective treatments:  NSAIDs Associated symptoms: dysuria (intermittent) and leg pain   Associated symptoms: no abdominal pain, no chest pain, no fever, no headaches and no numbness   Leg Pain Associated symptoms: back pain   Associated symptoms: no fever and no neck pain     Past Medical History  Diagnosis Date  . Osteomyelitis   . Mononucleosis   . Migraine   . Strep throat   . Anxiety   . ADHD (attention deficit hyperactivity disorder)    Past Surgical History  Procedure Laterality Date  . Knee surgery Bilateral    No family history on file. History  Substance Use Topics  . Smoking status: Never Smoker   . Smokeless tobacco: Not on file  . Alcohol Use: No    Review of Systems  Constitutional: Negative for fever.  HENT: Negative for drooling and rhinorrhea.   Eyes: Negative for pain.  Respiratory: Negative for cough and shortness of breath.   Cardiovascular: Negative for chest pain and leg swelling.  Gastrointestinal: Negative for nausea, vomiting, abdominal pain and diarrhea.  Genitourinary: Positive for dysuria (intermittent). Negative for hematuria.  Musculoskeletal: Positive for back pain. Negative for gait problem and neck pain.  Skin: Negative  for color change.  Neurological: Negative for numbness and headaches.  Hematological: Negative for adenopathy.  Psychiatric/Behavioral: Negative for behavioral problems.  All other systems reviewed and are negative.     Allergies  Penicillins  Home Medications   Prior to Admission medications   Medication Sig Start Date End Date Taking? Authorizing Provider  acetaminophen (TYLENOL) 325 MG tablet Take 650 mg by mouth every 6 (six) hours as needed. For pain    Historical Provider, MD  amphetamine-dextroamphetamine (ADDERALL) 20 MG tablet Take 20-40 mg by mouth daily as needed. For ADD    Historical Provider, MD  ibuprofen (ADVIL,MOTRIN) 200 MG tablet Take 200 mg by mouth every 6 (six) hours as needed for moderate pain.    Historical Provider, MD  methocarbamol (ROBAXIN-750) 750 MG tablet Take 1 tablet (750 mg total) by mouth every 8 (eight) hours as needed for muscle spasms. 06/03/14   Olivia Mackielga M Otter, MD  traMADol (ULTRAM) 50 MG tablet Take 1 tablet (50 mg total) by mouth every 6 (six) hours as needed. 06/03/14   Olivia Mackielga M Otter, MD   BP 109/46 mmHg  Pulse 54  Temp(Src) 98.1 F (36.7 C) (Oral)  Resp 16  Ht 6\' 1"  (1.854 m)  Wt 145 lb (65.772 kg)  BMI 19.13 kg/m2  SpO2 99% Physical Exam  Constitutional: He is oriented to person, place, and time. He appears well-developed and well-nourished.  HENT:  Head: Normocephalic and atraumatic.  Right Ear: External ear normal.  Left Ear: External ear normal.  Nose: Nose normal.  Mouth/Throat: Oropharynx is clear and moist. No oropharyngeal exudate.  Eyes: Conjunctivae and EOM are normal. Pupils are equal, round, and reactive to light.  Neck: Normal range of motion. Neck supple.  Cardiovascular: Normal rate, regular rhythm, normal heart sounds and intact distal pulses.  Exam reveals no gallop and no friction rub.   No murmur heard. Pulmonary/Chest: Effort normal and breath sounds normal. No respiratory distress. He has no wheezes.  Abdominal:  Soft. Bowel sounds are normal. He exhibits no distension. There is no tenderness. There is no rebound and no guarding.  Musculoskeletal: Normal range of motion. He exhibits tenderness. He exhibits no edema.  Midline lower lumbar tenderness to palpation.  Mild right lower lumbar paraspinal tenderness to palpation.  No CVA tenderness bilaterally.  Normal strength and distal pulses in bilateral lower extremities.he does note that he has felt some paresthesias on the distal phalanx of his right first toe for a long time now. Otherwise sensation is intact in his lower extremities.  Neurological: He is alert and oriented to person, place, and time.  Reflex Scores:      Patellar reflexes are 2+ on the right side and 2+ on the left side.      Achilles reflexes are 2+ on the right side and 2+ on the left side. Skin: Skin is warm and dry.  Psychiatric: He has a normal mood and affect. His behavior is normal.  Nursing note and vitals reviewed.   ED Course  Procedures (including critical care time) Labs Review Labs Reviewed  URINALYSIS, ROUTINE W REFLEX MICROSCOPIC    Imaging Review Dg Lumbar Spine Complete  08/18/2014   CLINICAL DATA:  Lower back pain radiating into the right leg down the right mid sign. No known injury or trauma.  EXAM: LUMBAR SPINE - COMPLETE 4+ VIEW  COMPARISON:  None.  FINDINGS: There is no evidence of lumbar spine fracture. Alignment is normal. Intervertebral disc spaces are maintained.  IMPRESSION: Negative.   Electronically Signed   By: Rosalie Gums M.D.   On: 08/18/2014 08:21     EKG Interpretation None      MDM   Final diagnoses:  Low back pain    8:07 AM 18 y.o. male with a self-reported history of osteomyelitis of his right knee in the past who presents with right lower back pain radiating down his right posterior thigh. The pain started gradually several days ago. 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 He is constantly bending over. He has had subjective fever and chills at home and some dysuria. He denies any drug use, history of diabetes or cancer, weakness/numbness, or bowel or bladder incontinence. Vital signs unremarkable here. We'll get screening plane, back, urinalysis, and pain control. Likely a musculoskeletal cause such as a back strain. Referred pain sounds like it could be sciatica.  9:45 AM:  I have discussed the diagnosis/risks/treatment options with the patient and believe the pt to be eligible for discharge home to follow-up with his pcp. We also discussed returning to the ED immediately if new or worsening sx occur. We discussed the sx which are most concerning (e.g., worsening pain, bp red flags discussed) that necessitate immediate return. Medications administered to the patient during their visit and any new prescriptions provided to the patient are listed below.  Medications given during this visit Medications  oxyCODONE-acetaminophen (PERCOCET/ROXICET) 5-325 MG per tablet 1 tablet (1 tablet Oral Given 08/18/14  [id number])    New Prescriptions   OXYCODONE-ACETAMINOPHEN (PERCOCET) 5-325 MG PER TABLET    Take 1 tablet by mouth every 6 (six) hours as needed.      Dylan SheffieldForrest Chayson Charters, MD 08/18/14 [phone]

## 2014-08-18 NOTE — ED Notes (Signed)
Pt made aware that urine specimen is needed. 

## 2014-08-18 NOTE — ED Notes (Signed)
MD at bedside. 

## 2014-08-18 NOTE — ED Notes (Signed)
Reports lower back pain, radiating down right leg to mid thigh. Reports taking aleve and ibuprofen with no relief. Denies injury relating to leg and back. Reports subjective fever.

## 2014-08-18 NOTE — Discharge Instructions (Signed)
Back Injury Prevention Back injuries can be extremely painful and difficult to heal. After having one back injury, you are much more likely to experience another later on. It is important to learn how to avoid injuring or re-injuring your back. The following tips can help you to prevent a back injury. PHYSICAL FITNESS  Exercise regularly and try to develop good tone in your abdominal muscles. Your abdominal muscles provide a lot of the support needed by your back.  Do aerobic exercises (walking, jogging, biking, swimming) regularly.  Do exercises that increase balance and strength (tai chi, yoga) regularly. This can decrease your risk of falling and injuring your back.  Stretch before and after exercising.  Maintain a healthy weight. The more you weigh, the more stress is placed on your back. For every pound of weight, 10 times that amount of pressure is placed on the back. DIET  Talk to your caregiver about how much calcium and vitamin D you need per day. These nutrients help to prevent weakening of the bones (osteoporosis). Osteoporosis can cause broken (fractured) bones that lead to back pain.  Include good sources of calcium in your diet, such as dairy products, green, leafy vegetables, and products with calcium added (fortified).  Include good sources of vitamin D in your diet, such as milk and foods that are fortified with vitamin D.  Consider taking a nutritional supplement or a multivitamin if needed.  Stop smoking if you smoke. POSTURE  Sit and stand up straight. Avoid leaning forward when you sit or hunching over when you stand.  Choose chairs with good low back (lumbar) support.  If you work at a desk, sit close to your work so you do not need to lean over. Keep your chin tucked in. Keep your neck drawn back and elbows bent at a right angle. Your arms should look like the letter "L."  Sit high and close to the steering wheel when you drive. Add a lumbar support to your car  seat if needed.  Avoid sitting or standing in one position for too long. Take breaks to get up, stretch, and walk around at least once every hour. Take breaks if you are driving for long periods of time.  Sleep on your side with your knees slightly bent, or sleep on your back with a pillow under your knees. Do not sleep on your stomach. LIFTING, TWISTING, AND REACHING  Avoid heavy lifting, especially repetitive lifting. If you must do heavy lifting:  Stretch before lifting.  Work slowly.  Rest between lifts.  Use carts and dollies to move objects when possible.  Make several small trips instead of carrying 1 heavy load.  Ask for help when you need it.  Ask for help when moving big, awkward objects.  Follow these steps when lifting:  Stand with your feet shoulder-width apart.  Get as close to the object as you can. Do not try to pick up heavy objects that are far from your body.  Use handles or lifting straps if they are available.  Bend at your knees. Squat down, but keep your heels off the floor.  Keep your shoulders pulled back, your chin tucked in, and your back straight.  Lift the object slowly, tightening the muscles in your legs, abdomen, and buttocks. Keep the object as close to the center of your body as possible.  When you put a load down, use these same guidelines in reverse.  Do not:  Lift the object above your waist.  Twist at the waist while lifting or carrying a load. Move your feet if you need to turn, not your waist.  Bend over without bending at your knees.  Avoid reaching over your head, across a table, or for an object on a high surface. OTHER TIPS  Avoid wet floors and keep sidewalks clear of ice to prevent falls.  Do not sleep on a mattress that is too soft or too hard.  Keep items that are used frequently within easy reach.  Put heavier objects on shelves at waist level and lighter objects on lower or higher shelves.  Find ways to  decrease your stress, such as exercise, massage, or relaxation techniques. Stress can build up in your muscles. Tense muscles are more vulnerable to injury.  Seek treatment for depression or anxiety if needed. These conditions can increase your risk of developing back pain. SEEK MEDICAL CARE IF:  You injure your back.  You have questions about diet, exercise, or other ways to prevent back injuries. MAKE SURE YOU:  Understand these instructions.  Will watch your condition.  Will get help right away if you are not doing well or get worse. Document Released: 09/15/2004 Document Revised: 10/31/2011 Document Reviewed: 09/19/2011 [employer] Patient Information 2015 [employer]. This information is not intended to replace advice given to you by your health care provider. Make sure you discuss any questions you have with your health care provider.  

## 2014-09-09 ENCOUNTER — Emergency Department (HOSPITAL_COMMUNITY): Payer: Self-pay

## 2014-09-09 ENCOUNTER — Emergency Department (HOSPITAL_COMMUNITY)
Admission: EM | Admit: 2014-09-09 | Discharge: 2014-09-09 | Disposition: A | Discharge status: Home / Self Care | Payer: Self-pay | Attending: Emergency Medicine | Admitting: Emergency Medicine

## 2014-09-09 ENCOUNTER — Encounter (HOSPITAL_COMMUNITY): Payer: Self-pay | Admitting: Emergency Medicine

## 2014-09-09 DIAGNOSIS — Z8739 Personal history of other diseases of the musculoskeletal system and connective tissue: Secondary | ICD-10-CM | POA: Insufficient documentation

## 2014-09-09 DIAGNOSIS — F419 Anxiety disorder, unspecified: Secondary | ICD-10-CM | POA: Insufficient documentation

## 2014-09-09 DIAGNOSIS — Z8619 Personal history of other infectious and parasitic diseases: Secondary | ICD-10-CM | POA: Insufficient documentation

## 2014-09-09 DIAGNOSIS — Z880 Allergy status to penicillin: Secondary | ICD-10-CM | POA: Insufficient documentation

## 2014-09-09 DIAGNOSIS — Z8679 Personal history of other diseases of the circulatory system: Secondary | ICD-10-CM | POA: Insufficient documentation

## 2014-09-09 DIAGNOSIS — F909 Attention-deficit hyperactivity disorder, unspecified type: Secondary | ICD-10-CM | POA: Insufficient documentation

## 2014-09-09 DIAGNOSIS — R200 Anesthesia of skin: Secondary | ICD-10-CM | POA: Insufficient documentation

## 2014-09-09 DIAGNOSIS — Z79899 Other long term (current) drug therapy: Secondary | ICD-10-CM | POA: Insufficient documentation

## 2014-09-09 DIAGNOSIS — R202 Paresthesia of skin: Secondary | ICD-10-CM | POA: Insufficient documentation

## 2014-09-09 DIAGNOSIS — R32 Unspecified urinary incontinence: Secondary | ICD-10-CM | POA: Insufficient documentation

## 2014-09-09 MED ORDER — METHOCARBAMOL 1000 MG/10ML IJ SOLN: 1000.0000 mg | Freq: Once | INTRAMUSCULAR | Status: DC

## 2014-09-09 MED ORDER — METHYLPREDNISOLONE SODIUM SUCC 125 MG IJ SOLR
125.0000 mg | Freq: Once | INTRAMUSCULAR | Status: AC
  Administered 2014-09-09: 125 mg via INTRAVENOUS
  Filled 2014-09-09: qty 2

## 2014-09-09 MED ORDER — SODIUM CHLORIDE 0.9 % IV SOLN
Freq: Once | INTRAVENOUS | Status: AC
  Administered 2014-09-09: 22:00:00 via INTRAVENOUS

## 2014-09-09 MED ORDER — HYDROCODONE-ACETAMINOPHEN 5-325 MG PO TABS: 1.0000 | ORAL_TABLET | Freq: Four times a day (QID) | ORAL | Status: AC | PRN

## 2014-09-09 MED ORDER — ONDANSETRON HCL 4 MG/2ML IJ SOLN
4.0000 mg | Freq: Once | INTRAMUSCULAR | Status: AC
  Administered 2014-09-09: 4 mg via INTRAVENOUS
  Filled 2014-09-09: qty 2

## 2014-09-09 MED ORDER — HYDROMORPHONE HCL 1 MG/ML IJ SOLN
1.0000 mg | Freq: Once | INTRAMUSCULAR | Status: AC
  Administered 2014-09-09: 1 mg via INTRAVENOUS
  Filled 2014-09-09: qty 1

## 2014-09-09 MED ORDER — PREDNISONE 20 MG PO TABS: ORAL_TABLET | ORAL | Status: AC

## 2014-09-09 MED ORDER — METHOCARBAMOL 750 MG PO TABS: 750.0000 mg | ORAL_TABLET | Freq: Three times a day (TID) | ORAL | Status: AC

## 2014-09-09 MED ORDER — METHOCARBAMOL 1000 MG/10ML IJ SOLN
1000.0000 mg | Freq: Once | INTRAVENOUS | Status: AC
  Administered 2014-09-09: 1000 mg via INTRAVENOUS
  Filled 2014-09-09 (×2): qty 10

## 2014-09-09 NOTE — ED Notes (Signed)
Pt. reports low back pain onset 2 weeks ago , denies injury / ambulatory , pain worse with movement . No urinary discomfort .

## 2014-09-09 NOTE — ED Provider Notes (Signed)
CSN: [id number]     Arrival date & time 09/09/14  1910 History   First MD Initiated Contact with Patient 09/09/14 2009     Chief Complaint  Patient presents with  . Back Pain     (Consider location/radiation/quality/duration/timing/severity/associated sxs/prior Treatment) HPI Comments: For the past several months has had low back pain radiating to R gluteal area and posterior R leg with numbness and tingling into toes.  This pain has gotten worse despite narcotic, muscle relaxers and steroids Has been seen by PCP and orthopedics today where he was to have an MRI but due to machine breakdown has been sent to  ED for further diagnostic testing   Patient is a 19 y.o. male presenting with back pain. The history is provided by the patient and a relative.  Back Pain Location:  Lumbar spine Quality:  Aching Radiates to:  R foot, R knee and R posterior upper leg Pain severity:  Severe Pain is:  Same all the time Onset quality:  Gradual Timing:  Constant Progression:  Worsening Chronicity:  Chronic Relieved by:  Nothing Worsened by:  Movement Ineffective treatments:  Bed rest, being still, heating pad, ibuprofen, muscle relaxants and narcotics Associated symptoms: bladder incontinence, leg pain and numbness   Associated symptoms: no chest pain, no dysuria, no fever, no perianal numbness and no weakness     Past Medical History  Diagnosis Date  . Osteomyelitis   . Mononucleosis   . Migraine   . Strep throat   . Anxiety   . ADHD (attention deficit hyperactivity disorder)    Past Surgical History  Procedure Laterality Date  . Knee surgery Bilateral    No family history on file. History  Substance Use Topics  . Smoking status: Never Smoker   . Smokeless tobacco: Not on file  . Alcohol Use: No    Review of Systems  Constitutional: Negative for fever.  Respiratory: Negative for shortness of breath.   Cardiovascular: Negative for chest pain.  Genitourinary: Positive for bladder  incontinence. Negative for dysuria and flank pain.       Urinary incontience  Musculoskeletal: Positive for back pain.  Neurological: Positive for numbness. Negative for dizziness and weakness.  All other systems reviewed and are negative.     Allergies  Penicillins  Home Medications   Prior to Admission medications   Medication Sig Start Date End Date Taking? Authorizing Provider  acetaminophen (TYLENOL) 325 MG tablet Take 650 mg by mouth every 6 (six) hours as needed. For pain   Yes Historical Provider, MD  amphetamine-dextroamphetamine (ADDERALL) 30 MG tablet Take 30 mg by mouth 2 (two) times daily.   Yes Historical Provider, MD  ibuprofen (ADVIL,MOTRIN) 200 MG tablet Take 200 mg by mouth every 6 (six) hours as needed for moderate pain.   Yes Historical Provider, MD  VIIBRYD 40 MG TABS Take 40 mg by mouth daily. 09/01/14  Yes Historical Provider, MD  HYDROcodone-acetaminophen (NORCO/VICODIN) 5-325 MG per tablet Take 1 tablet by mouth every 6 (six) hours as needed for severe pain. 09/09/14   Arman Filter, NP  methocarbamol (ROBAXIN-750) 750 MG tablet Take 1 tablet (750 mg total) by mouth 3 (three) times daily. 09/09/14   Arman Filter, NP  oxyCODONE-acetaminophen (PERCOCET) 5-325 MG per tablet Take 1 tablet by mouth every 6 (six) hours as needed. Patient not taking: Reported on 09/09/2014 08/18/14   Purvis Sheffield, MD  predniSONE (DELTASONE) 20 MG tablet 3 Tabs PO Days 1-3, then 2 tabs PO Days  4-6, then 1 tab PO Day 7-9, then Half Tab PO Day 10-12 09/09/14   Arman FilterGail K Dianna Ewald, NP  traMADol (ULTRAM) 50 MG tablet Take 1 tablet (50 mg total) by mouth every 6 (six) hours as needed. Patient not taking: Reported on 09/09/2014 06/03/14   Olivia Mackielga M Otter, MD   BP 131/69 mmHg  Pulse 76  Temp(Src) 98.3 F (36.8 C) (Oral)  Resp 19  Ht 6\' 1"  (1.854 m)  Wt 140 lb (63.504 kg)  BMI 18.47 kg/m2  SpO2 100% Physical Exam  Constitutional: He appears well-developed and well-nourished.  HENT:  Head:  Normocephalic.  Right Ear: External ear normal.  Left Ear: External ear normal.  Mouth/Throat: Oropharynx is clear and moist.  Eyes: Pupils are equal, round, and reactive to light.  Neck: Normal range of motion. Neck supple.  Cardiovascular: Normal rate and regular rhythm.   Pulmonary/Chest: Effort normal and breath sounds normal.  Abdominal: Soft. Bowel sounds are normal. He exhibits no distension. There is no tenderness.  Genitourinary: Rectal exam shows no tenderness and anal tone normal.  Musculoskeletal:       Lumbar back: He exhibits tenderness and pain. He exhibits no laceration and no spasm.       Back:  Neurological: He displays abnormal reflex. He displays no Babinski's sign on the right side.  Reflex Scores:      Achilles reflexes are 3+ on the right side. Skin: Skin is warm and dry.  Nursing note and vitals reviewed.   ED Course  Procedures (including critical care time) Labs Review Labs Reviewed - No data to display  Imaging Review Mr Lumbar Spine Wo Contrast  09/09/2014   CLINICAL DATA:  Low back pain radiating into the legs for 1 month. Numbness in legs and loss of bowel and bladder control at night. Patient reports history of RIGHT knee osteomyelitis.  EXAM: MRI LUMBAR SPINE WITHOUT CONTRAST  TECHNIQUE: Multiplanar, multisequence MR imaging of the lumbar spine was performed. No intravenous contrast was administered.  COMPARISON:  Lumbar spine radiographs August 18, 2014.  FINDINGS: Lumbar vertebral bodies intact. Minimal grade 1 L5-S1 anterolisthesis; chronic bilateral L5 pars interarticularis defects. No STIR signal abnormality to suggest acute osseous process. Intervertebral discs demonstrate normal morphology and signal characteristics.  Conus medullaris terminates at L1 and appears normal in morphology and signal characteristics. Cauda equina is unremarkable. Included prevertebral and paraspinal soft tissues are normal.  Level by level evaluation:  L1 to there L4-5:  No disc bulge, canal stenosis nor neural foraminal narrowing. Mild L4-5 facet arthropathy.  L5-S1: Anterolisthesis. Annular bulging. No canal stenosis or neural foraminal narrowing.  IMPRESSION: Chronic bilateral L5 pars interarticularis defects resulting in minimal grade 1 L5-S1 anterolisthesis.  No acute process within the lumbar spine. No neural compressive changes.   Electronically Signed   By: Awilda Metroourtnay  Bloomer   On: 09/09/2014 22:28     EKG Interpretation None     Feels better after IV medications  Had long conversation with patient about MRI results, treatment plan, follow up  Will given Rx for 12 day steroid taper, muscle relaxer, pain relief, exercises for his back pain and recommend follow up with his PCP as well as his ortho fro PT  MDM   Final diagnoses:  Numbness and tingling of right leg  Incontinence of urine         Arman FilterGail K Chesley Valls, NP 09/09/14 [phone]  Flint MelterElliott L Wentz, MD 09/09/14 [phone]

## 2014-09-09 NOTE — ED Notes (Signed)
Pt reports lower back pain radiating into legs x1 month.  Pt has had a negative x-ray on the 28th of December.  Pt also reports numbness in legs and loss of bowel and bladder control during the night.  Pt alert and oriented.

## 2014-09-09 NOTE — ED Notes (Signed)
Patient transported to MRI 

## 2015-12-28 IMAGING — CR DG CHEST 2V
1 series · 2 of 2 positions shown · non-contrast
Comparison: Radiograph 11/29/2013

CLINICAL DATA: Tightness in shoulders

EXAM:
CHEST  2 VIEW

[Series 1: w chest pa · 0.14mm/px · 2 of 2 slices shown]
[im 1/2]
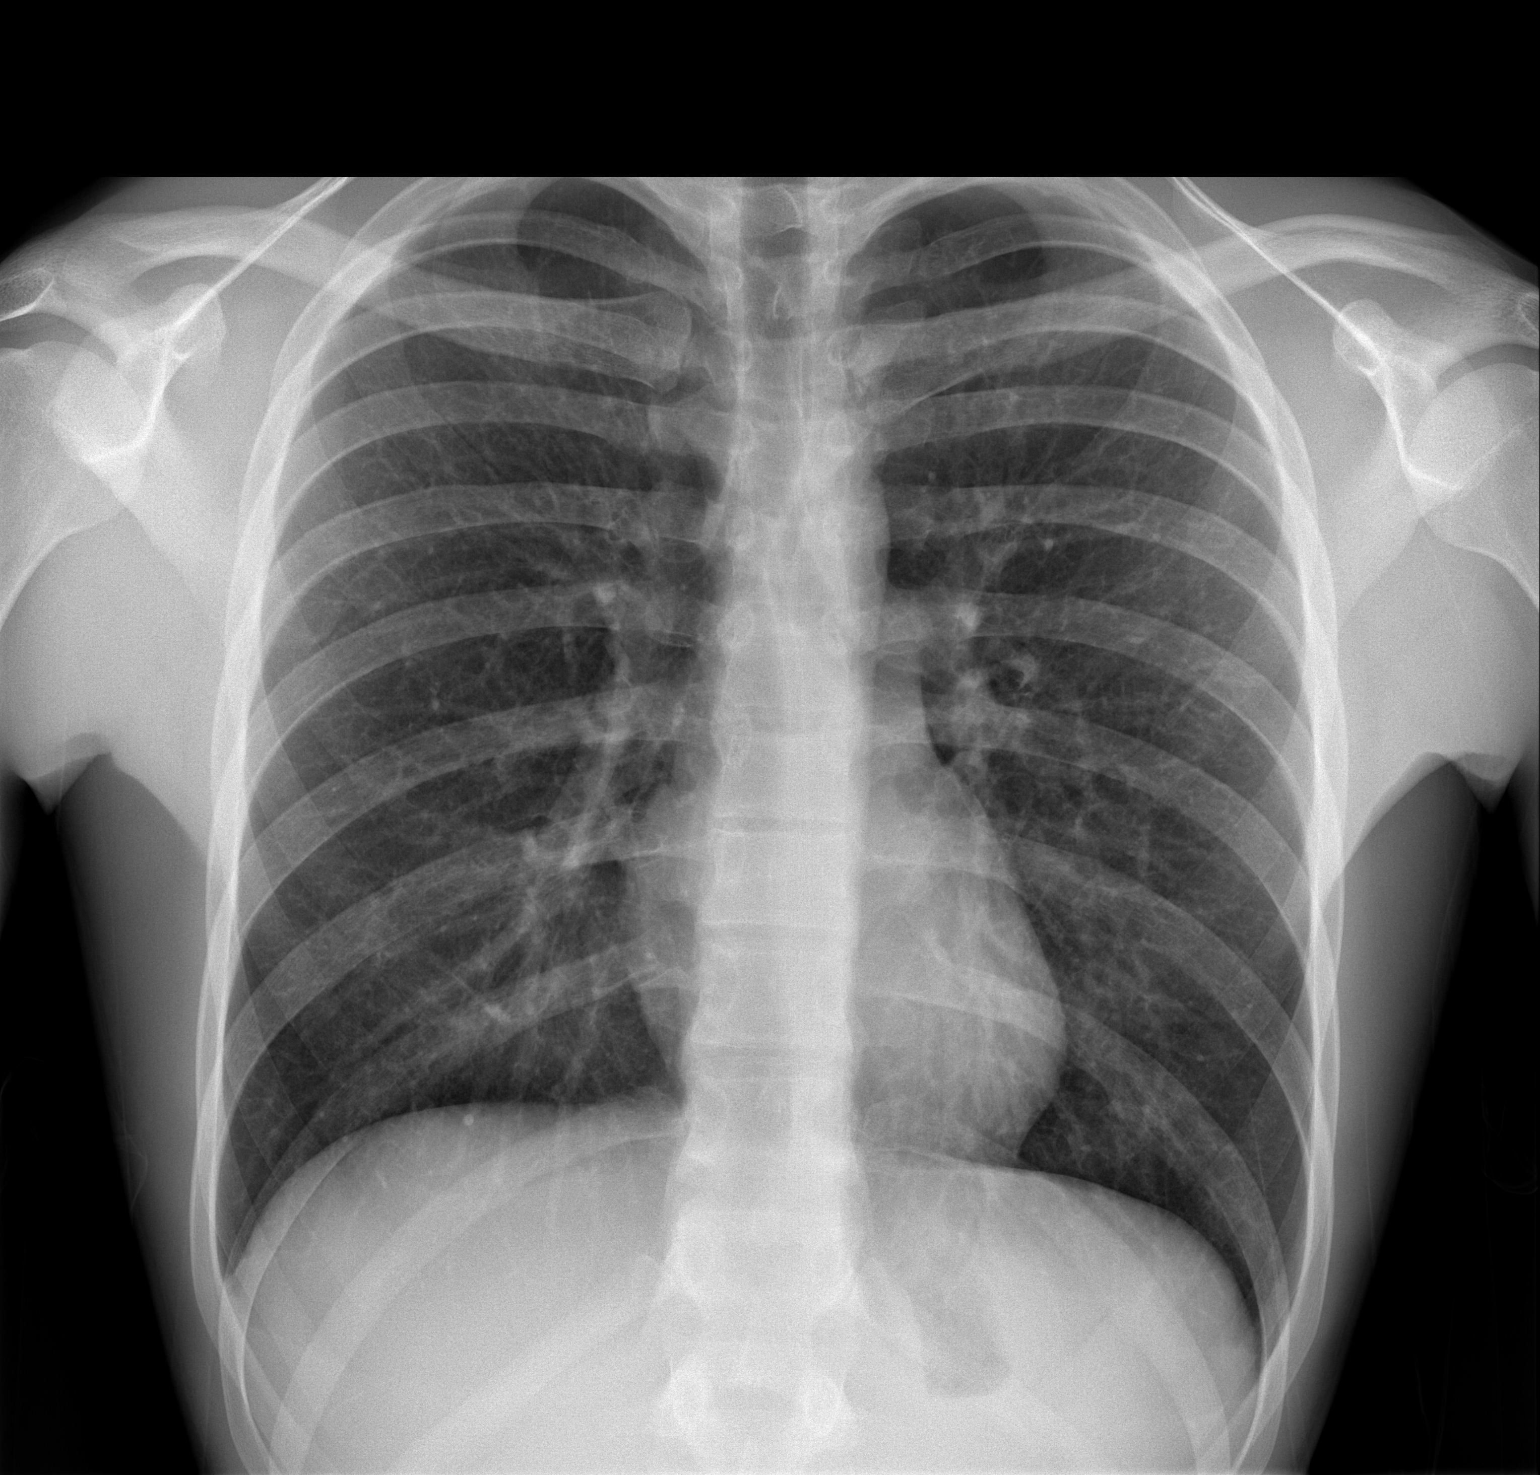
[im 2/2]
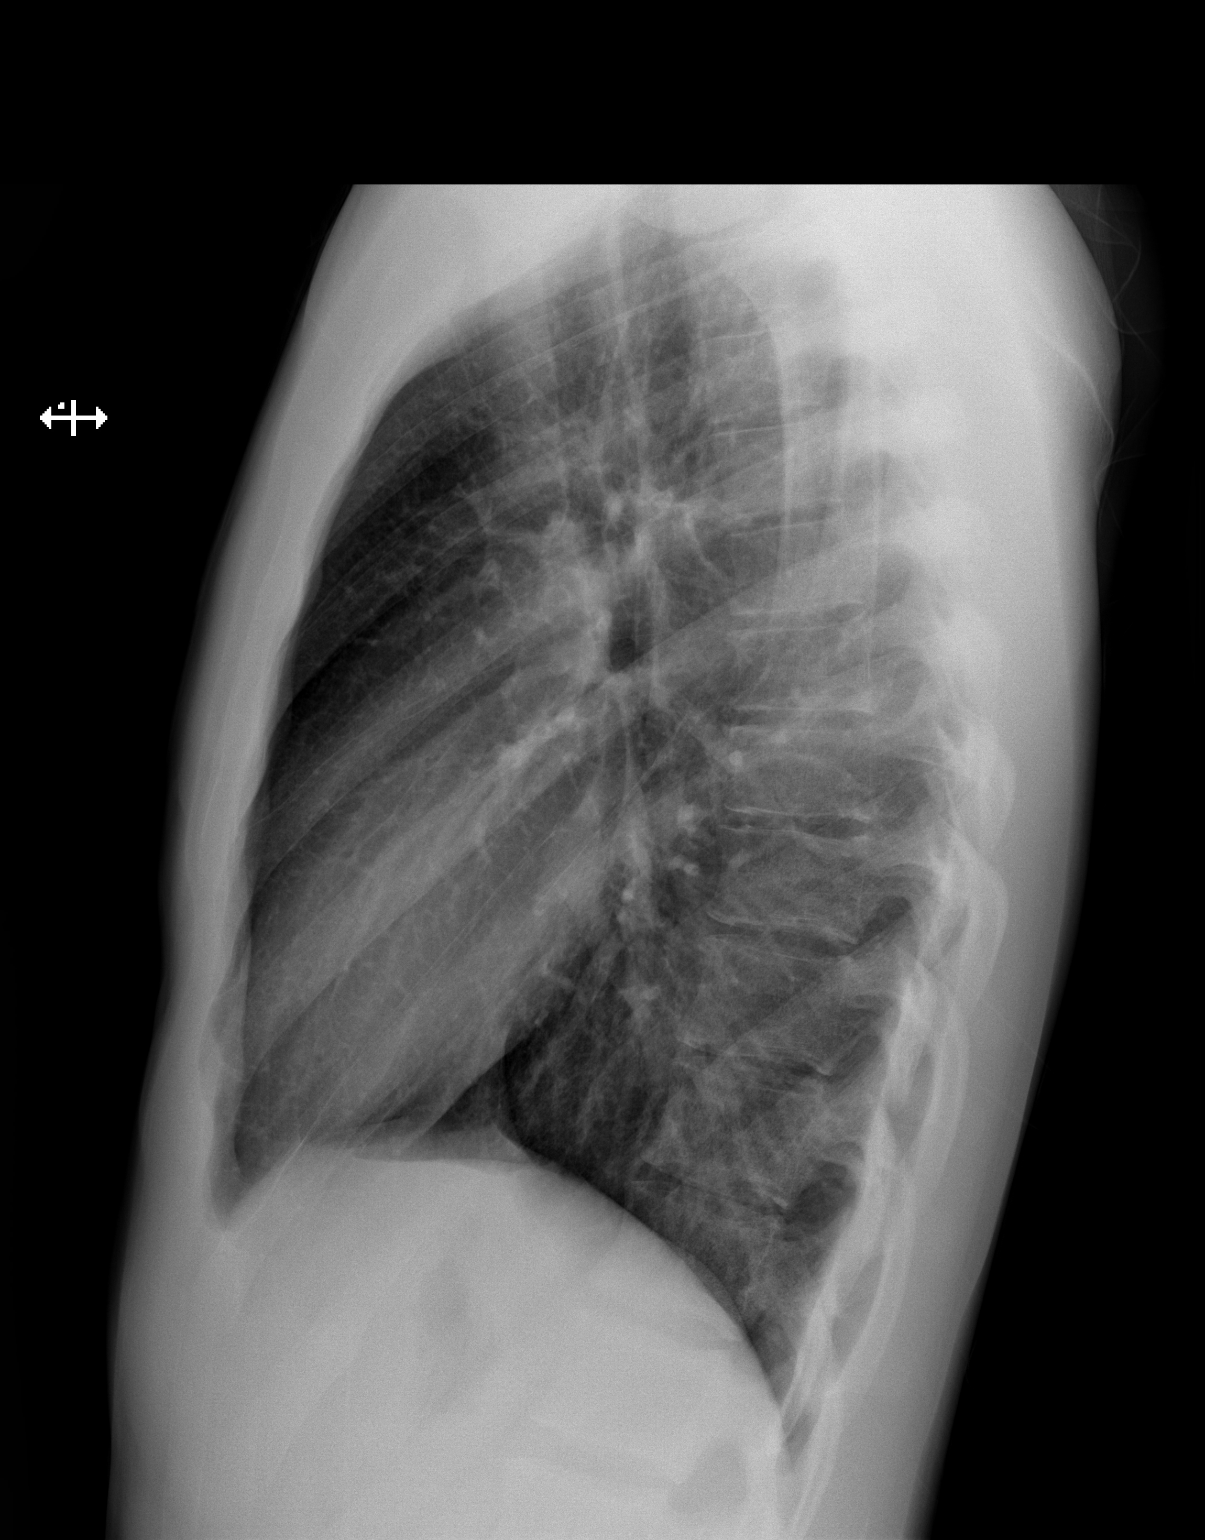

[2 of 2 positions shown; findings below may reference images not displayed]

FINDINGS: Normal mediastinum and cardiac silhouette. Normal pulmonary
vasculature. No evidence of effusion, infiltrate, or pneumothorax.
No acute bony abnormality.
IMPRESSION: No acute cardiopulmonary process.

## 2016-05-05 ENCOUNTER — Emergency Department
Admission: EM | Admit: 2016-05-05 | Discharge: 2016-05-06 | Disposition: A | Discharge status: Home / Self Care | Payer: Self-pay | Attending: Emergency Medicine | Admitting: Emergency Medicine

## 2016-05-05 ENCOUNTER — Encounter: Payer: Self-pay | Admitting: Emergency Medicine

## 2016-05-05 DIAGNOSIS — Z79899 Other long term (current) drug therapy: Secondary | ICD-10-CM | POA: Insufficient documentation

## 2016-05-05 DIAGNOSIS — Y929 Unspecified place or not applicable: Secondary | ICD-10-CM | POA: Insufficient documentation

## 2016-05-05 DIAGNOSIS — W890XXA Exposure to welding light (arc), initial encounter: Secondary | ICD-10-CM | POA: Insufficient documentation

## 2016-05-05 DIAGNOSIS — F909 Attention-deficit hyperactivity disorder, unspecified type: Secondary | ICD-10-CM | POA: Insufficient documentation

## 2016-05-05 DIAGNOSIS — T2611XA Burn of cornea and conjunctival sac, right eye, initial encounter: Secondary | ICD-10-CM | POA: Insufficient documentation

## 2016-05-05 DIAGNOSIS — Y9389 Activity, other specified: Secondary | ICD-10-CM | POA: Insufficient documentation

## 2016-05-05 DIAGNOSIS — Y999 Unspecified external cause status: Secondary | ICD-10-CM | POA: Insufficient documentation

## 2016-05-05 DIAGNOSIS — T2612XA Burn of cornea and conjunctival sac, left eye, initial encounter: Secondary | ICD-10-CM | POA: Insufficient documentation

## 2016-05-05 DIAGNOSIS — H16133 Photokeratitis, bilateral: Secondary | ICD-10-CM

## 2016-05-05 MED ORDER — OXYCODONE-ACETAMINOPHEN 5-325 MG PO TABS
2.0000 | ORAL_TABLET | Freq: Once | ORAL | Status: AC
  Administered 2016-05-05: 2 via ORAL
  Filled 2016-05-05: qty 2

## 2016-05-05 MED ORDER — ERYTHROMYCIN 5 MG/GM OP OINT: 1.0000 "application " | TOPICAL_OINTMENT | Freq: Four times a day (QID) | OPHTHALMIC | 0 refills | Status: AC

## 2016-05-05 MED ORDER — CIPROFLOXACIN HCL 0.3 % OP SOLN
2.0000 [drp] | OPHTHALMIC | Status: DC
  Administered 2016-05-05: 2 [drp] via OPHTHALMIC
  Filled 2016-05-05: qty 2.5

## 2016-05-05 MED ORDER — FLUORESCEIN SODIUM 1 MG OP STRP
2.0000 | ORAL_STRIP | Freq: Once | OPHTHALMIC | Status: AC
  Administered 2016-05-05: 2 via OPHTHALMIC
  Filled 2016-05-05: qty 2

## 2016-05-05 MED ORDER — ERYTHROMYCIN 5 MG/GM OP OINT
TOPICAL_OINTMENT | Freq: Once | OPHTHALMIC | Status: AC
  Administered 2016-05-05: 1 via OPHTHALMIC
  Filled 2016-05-05: qty 1

## 2016-05-05 MED ORDER — KETOROLAC TROMETHAMINE 0.5 % OP SOLN
1.0000 [drp] | Freq: Four times a day (QID) | OPHTHALMIC | Status: DC
Start: 2016-05-05 — End: 2016-05-06
  Administered 2016-05-05: 1 [drp] via OPHTHALMIC
  Filled 2016-05-05: qty 3

## 2016-05-05 MED ORDER — TETRACAINE HCL 0.5 % OP SOLN
2.0000 [drp] | Freq: Once | OPHTHALMIC | Status: AC
  Administered 2016-05-05: 2 [drp] via OPHTHALMIC
  Filled 2016-05-05: qty 2

## 2016-05-05 MED ORDER — KETOROLAC TROMETHAMINE 0.5 % OP SOLN: 1.0000 [drp] | Freq: Four times a day (QID) | OPHTHALMIC | 0 refills | Status: AC
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
## 2016-05-05 NOTE — ED Notes (Signed)
Visual acuity performed. R eye 20/100, pt could not see anything from L eye not even the 20/200 line. While performing visual acuity pt kept rubbing his eyes.

## 2016-05-05 NOTE — ED Triage Notes (Signed)
Patient ambulatory to triage with steady gait, without difficulty or distress noted; bilat eye pain/swelling since welding today

## 2016-05-06 NOTE — ED Notes (Signed)
Patient discharged to home per MD order. Patient in stable condition, and deemed medically cleared by ED provider for discharge. Discharge instructions reviewed with patient/family using "Teach Back"; verbalized understanding of medication education and administration, and information about follow-up care. Denies further concerns. ° °

## 2017-09-21 ENCOUNTER — Emergency Department
Admission: EM | Admit: 2017-09-21 | Discharge: 2017-09-21 | Disposition: A | Discharge status: Home / Self Care | Payer: Self-pay | Attending: Emergency Medicine | Admitting: Emergency Medicine

## 2017-09-21 ENCOUNTER — Encounter: Payer: Self-pay | Admitting: Emergency Medicine

## 2017-09-21 DIAGNOSIS — Z79899 Other long term (current) drug therapy: Secondary | ICD-10-CM | POA: Insufficient documentation

## 2017-09-21 DIAGNOSIS — R69 Illness, unspecified: Secondary | ICD-10-CM

## 2017-09-21 DIAGNOSIS — J111 Influenza due to unidentified influenza virus with other respiratory manifestations: Secondary | ICD-10-CM | POA: Insufficient documentation

## 2017-09-21 LAB — URINALYSIS, COMPLETE (UACMP) WITH MICROSCOPIC
BACTERIA UA: NONE SEEN
BILIRUBIN URINE: NEGATIVE
GLUCOSE, UA: NEGATIVE mg/dL
HGB URINE DIPSTICK: NEGATIVE
Ketones, ur: NEGATIVE mg/dL
LEUKOCYTES UA: NEGATIVE
NITRITE: NEGATIVE
Protein, ur: NEGATIVE mg/dL
SPECIFIC GRAVITY, URINE: 1.027 (ref 1.005–1.030)
Squamous Epithelial / LPF: NONE SEEN
pH: 7 (ref 5.0–8.0)

## 2017-09-21 LAB — COMPREHENSIVE METABOLIC PANEL
ALK PHOS: 90 U/L (ref 38–126)
ALT: 14 U/L — ABNORMAL LOW (ref 17–63)
AST: 22 U/L (ref 15–41)
Albumin: 4.9 g/dL (ref 3.5–5.0)
Anion gap: 13 (ref 5–15)
BILIRUBIN TOTAL: 1.8 mg/dL — AB (ref 0.3–1.2)
BUN: 14 mg/dL (ref 6–20)
CALCIUM: 9.9 mg/dL (ref 8.9–10.3)
CO2: 25 mmol/L (ref 22–32)
Chloride: 102 mmol/L (ref 101–111)
Creatinine, Ser: 0.77 mg/dL (ref 0.61–1.24)
GFR calc Af Amer: >60 mL/min (ref >60)
Glucose, Bld: 127 mg/dL — ABNORMAL HIGH (ref 65–99)
POTASSIUM: 3.8 mmol/L (ref 3.5–5.1)
Sodium: 140 mmol/L (ref 135–145)
TOTAL PROTEIN: 8.4 g/dL — AB (ref 6.5–8.1)

## 2017-09-21 LAB — CBC
HEMATOCRIT: 45.7 % (ref 40.0–52.0)
Hemoglobin: 15.5 g/dL (ref 13.0–18.0)
MCH: 30.1 pg (ref 26.0–34.0)
MCHC: 33.9 g/dL (ref 32.0–36.0)
MCV: 88.7 fL (ref 80.0–100.0)
Platelets: 227 10*3/uL (ref 150–440)
RBC: 5.15 MIL/uL (ref 4.40–5.90)
RDW: 12.6 % (ref 11.5–14.5)
WBC: 11.6 10*3/uL — AB (ref 3.8–10.6)

## 2017-09-21 LAB — LIPASE, BLOOD: Lipase: 22 U/L (ref 11–51)

## 2017-09-21 LAB — INFLUENZA PANEL BY PCR (TYPE A & B)
INFLAPCR: NEGATIVE
Influenza B By PCR: NEGATIVE

## 2017-09-21 MED ORDER — SODIUM CHLORIDE 0.9 % IV BOLUS (SEPSIS)
1000.0000 mL | Freq: Once | INTRAVENOUS | Status: AC
  Administered 2017-09-21: 1000 mL via INTRAVENOUS

## 2017-09-21 MED ORDER — ACETAMINOPHEN 500 MG PO TABS
1000.0000 mg | ORAL_TABLET | Freq: Once | ORAL | Status: AC
  Administered 2017-09-21: 1000 mg via ORAL
  Filled 2017-09-21: qty 2

## 2017-09-21 MED ORDER — ONDANSETRON HCL 4 MG/2ML IJ SOLN
4.0000 mg | Freq: Once | INTRAMUSCULAR | Status: AC
  Administered 2017-09-21: 4 mg via INTRAVENOUS
  Filled 2017-09-21: qty 2

## 2017-09-21 MED ORDER — KETOROLAC TROMETHAMINE 30 MG/ML IJ SOLN
15.0000 mg | Freq: Once | INTRAMUSCULAR | Status: AC
  Administered 2017-09-21: 15 mg via INTRAVENOUS
  Filled 2017-09-21: qty 1

## 2017-09-21 MED ORDER — ONDANSETRON HCL 4 MG PO TABS: 4.0000 mg | ORAL_TABLET | Freq: Every day | ORAL | 0 refills | Status: AC | PRN

## 2017-09-21 NOTE — ED Notes (Signed)

## 2017-09-21 NOTE — Discharge Instructions (Signed)
It is normal for you to be sick for a full 4-5 days with this illness.  Please make sure you remain well-hydrated and use ibuprofen and Tylenol as needed for symptomatic control.  Follow-up with your primary care physician as needed and return to the emergency department for any new or worsening symptoms such as worsening fever, if you cannot eat or drink, or for any other issues whatsoever.  It was a pleasure to take care of you today, and thank you for coming to our emergency department.  If you have any questions or concerns before leaving please ask the nurse to grab me and I'm more than happy to go through your aftercare instructions again.  If you were prescribed any opioid pain medication today such as Norco, Vicodin, Percocet, morphine, hydrocodone, or oxycodone please make sure you do not drive when you are taking this medication as it can alter your ability to drive safely.  If you have any concerns once you are home that you are not improving or are in fact getting worse before you can make it to your follow-up appointment, please do not hesitate to call [phone] and come back for further evaluation.  Merrily BrittleNeil Calyn Rubi, MD  Results for orders placed or performed during the hospital encounter of 09/21/17  Lipase, blood  Result Value Ref Range   Lipase 22 11 - 51 U/L  Comprehensive metabolic panel  Result Value Ref Range   Sodium 140 135 - 145 mmol/L   Potassium 3.8 3.5 - 5.1 mmol/L   Chloride 102 101 - 111 mmol/L   CO2 25 22 - 32 mmol/L   Glucose, Bld 127 (H) 65 - 99 mg/dL   BUN 14 6 - 20 mg/dL   Creatinine, Ser 1.610.77 0.61 - 1.24 mg/dL   Calcium 9.9 8.9 - 09.610.3 mg/dL   Total Protein 8.4 (H) 6.5 - 8.1 g/dL   Albumin 4.9 3.5 - 5.0 g/dL   AST 22 15 - 41 U/L   ALT 14 (L) 17 - 63 U/L   Alkaline Phosphatase 90 38 - 126 U/L   Total Bilirubin 1.8 (H) 0.3 - 1.2 mg/dL   GFR calc non Af Amer >60 >60 mL/min   GFR calc Af Amer >60 >60 mL/min   Anion gap 13 5 - 15  CBC  Result Value Ref Range   WBC 11.6 (H) 3.8 - 10.6 K/uL   RBC 5.15 4.40 - 5.90 MIL/uL   Hemoglobin 15.5 13.0 - 18.0 g/dL   HCT 04.545.7 40.940.0 - 81.152.0 %   MCV 88.7 80.0 - 100.0 fL   MCH 30.1 26.0 - 34.0 pg   MCHC 33.9 32.0 - 36.0 g/dL   RDW 91.412.6 78.211.5 - 95.614.5 %   Platelets 227 150 - 440 K/uL  Urinalysis, Complete w Microscopic  Result Value Ref Range   Color, Urine YELLOW (A) YELLOW   APPearance HAZY (A) CLEAR   Specific Gravity, Urine 1.027 1.005 - 1.030   pH 7.0 5.0 - 8.0   Glucose, UA NEGATIVE NEGATIVE mg/dL   Hgb urine dipstick NEGATIVE NEGATIVE   Bilirubin Urine NEGATIVE NEGATIVE   Ketones, ur NEGATIVE NEGATIVE mg/dL   Protein, ur NEGATIVE NEGATIVE mg/dL   Nitrite NEGATIVE NEGATIVE   Leukocytes, UA NEGATIVE NEGATIVE   RBC / HPF 0-5 0 - 5 RBC/hpf   WBC, UA 0-5 0 - 5 WBC/hpf   Bacteria, UA NONE SEEN NONE SEEN   Squamous Epithelial / LPF NONE SEEN NONE SEEN   Mucus PRESENT  Influenza panel by PCR (type A & B)  Result Value Ref Range   Influenza A By PCR NEGATIVE NEGATIVE   Influenza B By PCR NEGATIVE NEGATIVE

## 2017-09-21 NOTE — ED Provider Notes (Signed)
[facility] Emergency Department Provider Note  ____________________________________________   First MD Initiated Contact with Patient 09/21/17 [phone]     (approximate)  I have reviewed the triage vital signs and the nursing notes.   HISTORY  Chief Complaint Generalized Body Aches and Emesis   HPI Dylan Mejia is a 22 y.o. male who self presents the emergency department with roughly 24 hours of malaise myalgias muscle aches sore throat and dry cough.  He did not get a flu shot this year.  His symptoms began insidiously and have been intermittent.  Nothing seems to make them better or worse.  He reports mild headache.  He reports mild sore throat and is concerned he might have strep or mononucleosis.  Past Medical History:  Diagnosis Date  . ADHD (attention deficit hyperactivity disorder)   . Anxiety   . Migraine   . Mononucleosis   . Osteomyelitis (HCC)   . Strep throat     There are no active problems to display for this patient.   Past Surgical History:  Procedure Laterality Date  . KNEE SURGERY Bilateral     Prior to Admission medications   Medication Sig Start Date End Date Taking? Authorizing Provider  acetaminophen (TYLENOL) 325 MG tablet Take 650 mg by mouth every 6 (six) hours as needed. For pain    [provider]  amphetamine-dextroamphetamine (ADDERALL) 30 MG tablet Take 30 mg by mouth 2 (two) times daily.    [provider]  erythromycin ophthalmic ointment Place 1 application into both eyes 4 (four) times daily. 05/05/16   Triplett, Rulon Eisenmengerari B, FNP  HYDROcodone-acetaminophen (NORCO/VICODIN) 5-325 MG per tablet Take 1 tablet by mouth every 6 (six) hours as needed for severe pain. 09/09/14   Earley FavorSchulz, Gail, NP  ibuprofen (ADVIL,MOTRIN) 200 MG tablet Take 200 mg by mouth every 6 (six) hours as needed for moderate pain.    [provider]  ketorolac (ACULAR) 0.5 % ophthalmic solution Place 1 drop into both eyes 4 (four)  times daily. 05/05/16   Triplett, Rulon Eisenmengerari B, FNP  methocarbamol (ROBAXIN-750) 750 MG tablet Take 1 tablet (750 mg total) by mouth 3 (three) times daily. 09/09/14   Earley FavorSchulz, Gail, NP  ondansetron (ZOFRAN) 4 MG tablet Take 1 tablet (4 mg total) by mouth daily as needed. 09/21/17 09/21/18  Merrily Brittleifenbark, Wendel Homeyer, MD  oxyCODONE-acetaminophen (PERCOCET) 5-325 MG per tablet Take 1 tablet by mouth every 6 (six) hours as needed. Patient not taking: Reported on 09/09/2014 08/18/14   Purvis SheffieldHarrison, Forrest, MD  predniSONE (DELTASONE) 20 MG tablet 3 Tabs PO Days 1-3, then 2 tabs PO Days 4-6, then 1 tab PO Day 7-9, then Half Tab PO Day 10-12 09/09/14   Earley FavorSchulz, Gail, NP  traMADol (ULTRAM) 50 MG tablet Take 1 tablet (50 mg total) by mouth every 6 (six) hours as needed. Patient not taking: Reported on 09/09/2014 06/03/14   Marisa Severintter, Olga, MD  VIIBRYD 40 MG TABS Take 40 mg by mouth daily. 09/01/14   [provider]    Allergies Penicillins  No family history on file.  Social History Social History   Tobacco Use  . Smoking status: Never Smoker  . Smokeless tobacco: Never Used  Substance Use Topics  . Alcohol use: No  . Drug use: No    Review of Systems Constitutional: Positive for subjective fever Eyes: No visual changes. ENT: Positive for sore throat. Cardiovascular: Denies chest pain. Respiratory: Denies shortness of breath. Gastrointestinal: No abdominal pain.  Positive for nausea, no  vomiting.  No diarrhea.  No constipation. Genitourinary: Negative for dysuria. Musculoskeletal: Negative for back pain. Skin: Negative for rash. Neurological: Negative for headaches, focal weakness or numbness.   ____________________________________________   PHYSICAL EXAM:  VITAL SIGNS: ED Triage Vitals  Enc Vitals Group     BP 09/21/17 0020 130/73     Pulse Rate 09/21/17 0020 79     Resp 09/21/17 0020 16     Temp 09/21/17 0020 98.5 F (36.9 C)     Temp Source 09/21/17 0020 Oral     SpO2 09/21/17 0020 97 %      Weight 09/21/17 0019 150 lb (68 kg)     Height 09/21/17 0019 6\' 1"  (1.854 m)     Head Circumference --      Peak Flow --      Pain Score 09/21/17 0019 10     Pain Loc --      Pain Edu? --      Excl. in GC? --     Constitutional: Alert and oriented x4 pleasant cooperative speaks in full clear sentences no diaphoresis Eyes: PERRL EOMI. Head: Atraumatic. Nose: No congestion/rhinnorhea. Mouth/Throat: No trismus uvula midline mild pharyngeal erythema no exudate no lymphadenopathy Neck: No stridor.   Cardiovascular: Normal rate, regular rhythm. Grossly normal heart sounds.  Good peripheral circulation. Respiratory: Normal respiratory effort.  No retractions. Lungs CTAB and moving good air Gastrointestinal: Soft nontender Musculoskeletal: No lower extremity edema   Neurologic:  Normal speech and language. No gross focal neurologic deficits are appreciated. Skin:  Skin is warm, dry and intact. No rash noted. Psychiatric: Mood and affect are normal. Speech and behavior are normal.    ____________________________________________   DIFFERENTIAL includes but not limited to  Mononucleosis, influenza, influenza-like illness, strep throat, bacteremia ____________________________________________   LABS (all labs ordered are listed, but only abnormal results are displayed)  Labs Reviewed  COMPREHENSIVE METABOLIC PANEL - Abnormal; Notable for the following components:      Result Value   Glucose, Bld 127 (*)    Total Protein 8.4 (*)    ALT 14 (*)    Total Bilirubin 1.8 (*)    All other components within normal limits  CBC - Abnormal; Notable for the following components:   WBC 11.6 (*)    All other components within normal limits  URINALYSIS, COMPLETE (UACMP) WITH MICROSCOPIC - Abnormal; Notable for the following components:   Color, Urine YELLOW (*)    APPearance HAZY (*)    All other components within normal limits  LIPASE, BLOOD  INFLUENZA PANEL BY PCR (TYPE A & B)    Lab work  reviewed by me with elevated white count which is nonspecific and likely secondary to pain and stress flu negative __________________________________________  EKG   ____________________________________________  RADIOLOGY   ____________________________________________   PROCEDURES  Procedure(s) performed: no  Procedures  Critical Care performed: no  Observation: no ____________________________________________   INITIAL IMPRESSION / ASSESSMENT AND PLAN / ED COURSE  Pertinent labs & imaging results that were available during my care of the patient were reviewed by me and considered in my medical decision making (see chart for details).  The patient has viral syndrome.  Influenza negative.  He feels improved after IV hydration and nonsteroidals.  Will discharge home with a short course of Zofran and ibuprofen.  Strict return precautions given the patient verbalized understanding and agreement the plan.      ____________________________________________   FINAL CLINICAL IMPRESSION(S) / ED DIAGNOSES  Final diagnoses:  Influenza-like illness      NEW MEDICATIONS STARTED DURING THIS VISIT:  Discharge Medication List as of 09/21/2017  4:23 AM    START taking these medications   Details  ondansetron (ZOFRAN) 4 MG tablet Take 1 tablet (4 mg total) by mouth daily as needed., Starting Thu 09/21/2017, Until Fri 09/21/2018, Print         Note:  This document was prepared using Dragon voice recognition software and may include unintentional dictation errors.     Merrily Brittle, MD 09/21/17 [phone]

## 2017-09-21 NOTE — ED Notes (Signed)
Family out in hallway requesting update, MD aware.

## 2017-09-21 NOTE — ED Triage Notes (Signed)
Pt comes into the ED via POV c/o generalized body aches and emesis for x3 days.  Patient states the vomiting has progressed today and he is unable to keep food and fluids down.  Family states the highest fever has been 102.  Patient in NAD at this time with even and unlabored respirations.

## 2017-09-21 NOTE — ED Notes (Signed)
Pt awoken from sleep. Asked how he was feeling states "I don't know." reports pain 8-9/10
